# Patient Record
Sex: Male | Born: 1955 | Race: White | Hispanic: No | Marital: Single | Smoking: Never smoker
Health system: Southern US, Community
[De-identification: ages and names within clinical notes are randomized; demographics above are authoritative.]

## PROBLEM LIST (undated history)

## (undated) DIAGNOSIS — F1910 Other psychoactive substance abuse, uncomplicated: Secondary | ICD-10-CM

## (undated) DIAGNOSIS — I10 Essential (primary) hypertension: Secondary | ICD-10-CM

## (undated) DIAGNOSIS — H269 Unspecified cataract: Secondary | ICD-10-CM

## (undated) DIAGNOSIS — T7840XA Allergy, unspecified, initial encounter: Secondary | ICD-10-CM

## (undated) DIAGNOSIS — R569 Unspecified convulsions: Secondary | ICD-10-CM

## (undated) DIAGNOSIS — I2510 Atherosclerotic heart disease of native coronary artery without angina pectoris: Secondary | ICD-10-CM

## (undated) DIAGNOSIS — E785 Hyperlipidemia, unspecified: Secondary | ICD-10-CM

## (undated) HISTORY — DX: Hyperlipidemia, unspecified: E78.5

## (undated) HISTORY — DX: Allergy, unspecified, initial encounter: T78.40XA

## (undated) HISTORY — DX: Atherosclerotic heart disease of native coronary artery without angina pectoris: I25.10

## (undated) HISTORY — PX: COLONOSCOPY: SHX174

## (undated) HISTORY — DX: Other psychoactive substance abuse, uncomplicated: F19.10

## (undated) HISTORY — DX: Unspecified cataract: H26.9

## (undated) HISTORY — DX: Essential (primary) hypertension: I10

## (undated) HISTORY — DX: Unspecified convulsions: R56.9

---
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
## 2003-09-12 ENCOUNTER — Emergency Department (HOSPITAL_COMMUNITY): Admission: EM | Admit: 2003-09-12 | Discharge: 2003-09-12 | Payer: Self-pay | Admitting: Emergency Medicine

## 2005-11-09 ENCOUNTER — Ambulatory Visit: Payer: Self-pay | Admitting: Internal Medicine

## 2005-11-17 ENCOUNTER — Emergency Department (HOSPITAL_COMMUNITY): Admission: EM | Admit: 2005-11-17 | Discharge: 2005-11-17 | Payer: Self-pay | Admitting: Emergency Medicine

## 2005-11-21 ENCOUNTER — Ambulatory Visit: Payer: Self-pay | Admitting: Internal Medicine

## 2005-11-29 ENCOUNTER — Encounter: Admission: RE | Admit: 2005-11-29 | Discharge: 2005-11-29 | Payer: Self-pay | Admitting: Internal Medicine

## 2005-12-04 ENCOUNTER — Encounter: Admission: RE | Admit: 2005-12-04 | Discharge: 2005-12-04 | Payer: Self-pay | Admitting: Internal Medicine

## 2008-06-08 ENCOUNTER — Encounter: Admission: RE | Admit: 2008-06-08 | Discharge: 2008-06-08 | Payer: Self-pay | Admitting: Internal Medicine

## 2010-08-28 ENCOUNTER — Encounter: Payer: Self-pay | Admitting: Internal Medicine

## 2015-10-22 ENCOUNTER — Encounter: Payer: Self-pay | Admitting: Internal Medicine

## 2016-05-24 DIAGNOSIS — E041 Nontoxic single thyroid nodule: Secondary | ICD-10-CM | POA: Diagnosis not present

## 2016-05-24 DIAGNOSIS — R8299 Other abnormal findings in urine: Secondary | ICD-10-CM | POA: Diagnosis not present

## 2016-05-24 DIAGNOSIS — E784 Other hyperlipidemia: Secondary | ICD-10-CM | POA: Diagnosis not present

## 2016-05-24 DIAGNOSIS — I10 Essential (primary) hypertension: Secondary | ICD-10-CM | POA: Diagnosis not present

## 2016-05-24 DIAGNOSIS — R7301 Impaired fasting glucose: Secondary | ICD-10-CM | POA: Diagnosis not present

## 2016-05-24 DIAGNOSIS — Z125 Encounter for screening for malignant neoplasm of prostate: Secondary | ICD-10-CM | POA: Diagnosis not present

## 2016-05-31 DIAGNOSIS — R7301 Impaired fasting glucose: Secondary | ICD-10-CM | POA: Diagnosis not present

## 2016-05-31 DIAGNOSIS — R05 Cough: Secondary | ICD-10-CM | POA: Diagnosis not present

## 2016-05-31 DIAGNOSIS — E784 Other hyperlipidemia: Secondary | ICD-10-CM | POA: Diagnosis not present

## 2016-05-31 DIAGNOSIS — Z0000 Encounter for general adult medical examination without abnormal findings: Secondary | ICD-10-CM | POA: Diagnosis not present

## 2016-05-31 DIAGNOSIS — Z125 Encounter for screening for malignant neoplasm of prostate: Secondary | ICD-10-CM | POA: Diagnosis not present

## 2016-05-31 DIAGNOSIS — Z1389 Encounter for screening for other disorder: Secondary | ICD-10-CM | POA: Diagnosis not present

## 2016-05-31 DIAGNOSIS — Z23 Encounter for immunization: Secondary | ICD-10-CM | POA: Diagnosis not present

## 2016-05-31 DIAGNOSIS — I10 Essential (primary) hypertension: Secondary | ICD-10-CM | POA: Diagnosis not present

## 2016-06-15 ENCOUNTER — Encounter: Payer: Self-pay | Admitting: Internal Medicine

## 2016-08-10 ENCOUNTER — Ambulatory Visit (AMBULATORY_SURGERY_CENTER): Payer: Self-pay | Admitting: *Deleted

## 2016-08-10 VITALS — Ht 71.0 in | Wt 179.8 lb

## 2016-08-10 DIAGNOSIS — Z1211 Encounter for screening for malignant neoplasm of colon: Secondary | ICD-10-CM

## 2016-08-10 MED ORDER — NA SULFATE-K SULFATE-MG SULF 17.5-3.13-1.6 GM/177ML PO SOLN: ORAL | 0 refills | Status: DC

## 2016-08-10 NOTE — Progress Notes (Signed)
No egg or soy allergy  No anesthesia or intubation problems per pt  No diet medications taken  Registered in EMMI  No home oxygen used or hx of sleep apnea  PNM than $50 coupon Suprep given

## 2016-08-16 ENCOUNTER — Encounter: Payer: Self-pay | Admitting: Internal Medicine

## 2016-08-23 ENCOUNTER — Ambulatory Visit (AMBULATORY_SURGERY_CENTER): Payer: BLUE CROSS/BLUE SHIELD | Admitting: Internal Medicine

## 2016-08-23 ENCOUNTER — Encounter: Payer: Self-pay | Admitting: Internal Medicine

## 2016-08-23 VITALS — BP 122/79 | HR 76 | Temp 97.8°F | Resp 12 | Ht 71.0 in | Wt 179.0 lb

## 2016-08-23 DIAGNOSIS — Z1212 Encounter for screening for malignant neoplasm of rectum: Secondary | ICD-10-CM

## 2016-08-23 DIAGNOSIS — D125 Benign neoplasm of sigmoid colon: Secondary | ICD-10-CM

## 2016-08-23 DIAGNOSIS — K635 Polyp of colon: Secondary | ICD-10-CM | POA: Diagnosis not present

## 2016-08-23 DIAGNOSIS — Z1211 Encounter for screening for malignant neoplasm of colon: Secondary | ICD-10-CM | POA: Diagnosis present

## 2016-08-23 MED ORDER — SODIUM CHLORIDE 0.9 % IV SOLN: 500.0000 mL | INTRAVENOUS | Status: AC

## 2016-08-23 NOTE — Op Note (Signed)
[facility] Patient Name: Cameron Obrien Procedure Date: 08/23/2016 12:05 PM MRN: [id number] Endoscopist: Docia Chuck. Henrene Pastor , MD Age: 61 Referring MD:  Date of Birth: 1956/02/07 Gender: Male Account #: [id number] Procedure:                Colonoscopy with cold snare polypectomy x 1 Indications:              Screening for colorectal malignant neoplasm.                            Negative index exam 2007 Medicines:                Monitored Anesthesia Care Procedure:                Pre-Anesthesia Assessment:                           - Prior to the procedure, a History and Physical                            was performed, and patient medications and                            allergies were reviewed. The patient's tolerance of                            previous anesthesia was also reviewed. The risks                            and benefits of the procedure and the sedation                            options and risks were discussed with the patient.                            All questions were answered, and informed consent                            was obtained. Prior Anticoagulants: The patient has                            taken no previous anticoagulant or antiplatelet                            agents. ASA Grade Assessment: II - A patient with                            mild systemic disease. After reviewing the risks                            and benefits, the patient was deemed in                            satisfactory condition to undergo the procedure.  After obtaining informed consent, the colonoscope                            was passed under direct vision. Throughout the                            procedure, the patient's blood pressure, pulse, and                            oxygen saturations were monitored continuously. The                            Model CF-HQ190L [id number]) scope was introduced                            through  the anus and advanced to the the cecum,                            identified by appendiceal orifice and ileocecal                            valve. The ileocecal valve, appendiceal orifice,                            and rectum were photographed. The quality of the                            bowel preparation was excellent. The colonoscopy                            was performed without difficulty. The patient                            tolerated the procedure well. The bowel preparation                            used was SUPREP. Scope In: 12:27:09 PM Scope Out: 12:39:45 PM Total Procedure Duration: 0 hours 12 minutes 36 seconds  Findings:                 A 5 mm polyp was found in the sigmoid colon. The                            polyp was removed with a cold snare. Resection and                            retrieval were complete.                           A few diverticula were found in the cecum,                            ascending colon, and sigmoid colon.  Internal hemorrhoids were found during retroflexion.                           The exam was otherwise without abnormality on                            direct and retroflexion views. Complications:            No immediate complications. Estimated blood loss:                            None. Estimated Blood Loss:     Estimated blood loss: none. Impression:               - One 5 mm polyp in the sigmoid colon, removed with                            a cold snare. Resected and retrieved.                           - Diverticulosis in the colon.                           - Internal hemorrhoids.                           - The examination was otherwise normal on direct                            and retroflexion views. Recommendation:           - Repeat colonoscopy in 5-10 years for surveillance.                           - Patient has a contact number available for                            emergencies. The  signs and symptoms of potential                            delayed complications were discussed with the                            patient. Return to normal activities tomorrow.                            Written discharge instructions were provided to the                            patient.                           - Resume previous diet.                           - Continue present medications.                           -  Await pathology results. Docia Chuck. Henrene Pastor, MD 08/23/2016 12:46:00 PM This report has been signed electronically.

## 2016-08-23 NOTE — Patient Instructions (Signed)
YOU HAD AN ENDOSCOPIC PROCEDURE TODAY AT THE [facility]:   Refer to the procedure report that was given to you for any specific questions about what was found during the examination.  If the procedure report does not answer your questions, please call your gastroenterologist to clarify.  If you requested that your care partner not be given the details of your procedure findings, then the procedure report has been included in a sealed envelope for you to review at your convenience later.  YOU SHOULD EXPECT: Some feelings of bloating in the abdomen. Passage of more gas than usual.  Walking can help get rid of the air that was put into your GI tract during the procedure and reduce the bloating. If you had a lower endoscopy (such as a colonoscopy or flexible sigmoidoscopy) you may notice spotting of blood in your stool or on the toilet paper. If you underwent a bowel prep for your procedure, you may not have a normal bowel movement for a few days.  Please Note:  You might notice some irritation and congestion in your nose or some drainage.  This is from the oxygen used during your procedure.  There is no need for concern and it should clear up in a day or so.  SYMPTOMS TO REPORT IMMEDIATELY:   Following lower endoscopy (colonoscopy or flexible sigmoidoscopy):  Excessive amounts of blood in the stool  Significant tenderness or worsening of abdominal pains  Swelling of the abdomen that is new, acute  Fever of 100F or higher   Following upper endoscopy (EGD)  Vomiting of blood or coffee ground material  New chest pain or pain under the shoulder blades  Painful or persistently difficult swallowing  New shortness of breath  Fever of 100F or higher  Black, tarry-looking stools  For urgent or emergent issues, a gastroenterologist can be reached at any hour by calling [phone].   DIET:  We do recommend a small meal at first, but then you may proceed to your regular diet.  Drink  plenty of fluids but you should avoid alcoholic beverages for 24 hours.  ACTIVITY:  You should plan to take it easy for the rest of today and you should NOT DRIVE or use heavy machinery until tomorrow (because of the sedation medicines used during the test).    FOLLOW UP: Our staff will call the number listed on your records the next business day following your procedure to check on you and address any questions or concerns that you may have regarding the information given to you following your procedure. If we do not reach you, we will leave a message.  However, if you are feeling well and you are not experiencing any problems, there is no need to return our call.  We will assume that you have returned to your regular daily activities without incident.  If any biopsies were taken you will be contacted by phone or by letter within the next 1-3 weeks.  Please call us at [phone] if you have not heard about the biopsies in 3 weeks.    SIGNATURES/CONFIDENTIALITY: You and/or your care partner have signed paperwork which will be entered into your electronic medical record.  These signatures attest to the fact that that the information above on your After Visit Summary has been reviewed and is understood.  Full responsibility of the confidentiality of this discharge information lies with you and/or your care-partner.  Polyp, diverticulosis, high fiber diet and hemorrhoid information given. 

## 2016-08-23 NOTE — Progress Notes (Signed)
Called to room to assist during endoscopic procedure.  Patient ID and intended procedure confirmed with present staff. Received instructions for my participation in the procedure from the performing physician.  

## 2016-08-23 NOTE — Progress Notes (Signed)
Patient awakening,vss,report to rn 

## 2016-08-25 ENCOUNTER — Telehealth: Payer: Self-pay | Admitting: *Deleted

## 2016-08-25 NOTE — Telephone Encounter (Signed)
Message left at [phone].

## 2016-08-28 ENCOUNTER — Telehealth: Payer: Self-pay

## 2016-08-28 NOTE — Telephone Encounter (Signed)
  Follow up Call-  Call back number 08/23/2016  Post procedure Call Back phone  # [phone]  Permission to leave phone message Yes  Some recent data might be hidden     Patient questions:  Do you have a fever, pain , or abdominal swelling? No. Pain Score  0 *  Have you tolerated food without any problems? Yes.    Have you been able to return to your normal activities? Yes.    Do you have any questions about your discharge instructions: Diet   No. Medications  No. Follow up visit  No.  Do you have questions or concerns about your Care? No.  Actions: * If pain score is 4 or above: No action needed, pain <4.

## 2016-08-29 ENCOUNTER — Encounter: Payer: Self-pay | Admitting: Internal Medicine

## 2016-12-21 DIAGNOSIS — H2513 Age-related nuclear cataract, bilateral: Secondary | ICD-10-CM | POA: Diagnosis not present

## 2017-01-03 DIAGNOSIS — H2512 Age-related nuclear cataract, left eye: Secondary | ICD-10-CM | POA: Diagnosis not present

## 2017-01-03 DIAGNOSIS — H2511 Age-related nuclear cataract, right eye: Secondary | ICD-10-CM | POA: Diagnosis not present

## 2017-01-10 DIAGNOSIS — H2512 Age-related nuclear cataract, left eye: Secondary | ICD-10-CM | POA: Diagnosis not present

## 2017-01-24 DIAGNOSIS — Z6824 Body mass index (BMI) 24.0-24.9, adult: Secondary | ICD-10-CM | POA: Diagnosis not present

## 2017-01-24 DIAGNOSIS — J069 Acute upper respiratory infection, unspecified: Secondary | ICD-10-CM | POA: Diagnosis not present

## 2017-01-24 DIAGNOSIS — R05 Cough: Secondary | ICD-10-CM | POA: Diagnosis not present

## 2017-05-10 DIAGNOSIS — M25522 Pain in left elbow: Secondary | ICD-10-CM | POA: Diagnosis not present

## 2017-05-10 DIAGNOSIS — Z23 Encounter for immunization: Secondary | ICD-10-CM | POA: Diagnosis not present

## 2017-05-10 DIAGNOSIS — L259 Unspecified contact dermatitis, unspecified cause: Secondary | ICD-10-CM | POA: Diagnosis not present

## 2017-05-10 DIAGNOSIS — Z6824 Body mass index (BMI) 24.0-24.9, adult: Secondary | ICD-10-CM | POA: Diagnosis not present

## 2017-05-16 DIAGNOSIS — L249 Irritant contact dermatitis, unspecified cause: Secondary | ICD-10-CM | POA: Diagnosis not present

## 2017-05-31 DIAGNOSIS — E041 Nontoxic single thyroid nodule: Secondary | ICD-10-CM | POA: Diagnosis not present

## 2017-05-31 DIAGNOSIS — I10 Essential (primary) hypertension: Secondary | ICD-10-CM | POA: Diagnosis not present

## 2017-05-31 DIAGNOSIS — R7301 Impaired fasting glucose: Secondary | ICD-10-CM | POA: Diagnosis not present

## 2017-05-31 DIAGNOSIS — Z125 Encounter for screening for malignant neoplasm of prostate: Secondary | ICD-10-CM | POA: Diagnosis not present

## 2017-05-31 DIAGNOSIS — Z0000 Encounter for general adult medical examination without abnormal findings: Secondary | ICD-10-CM | POA: Diagnosis not present

## 2017-06-04 DIAGNOSIS — Z1212 Encounter for screening for malignant neoplasm of rectum: Secondary | ICD-10-CM | POA: Diagnosis not present

## 2017-06-05 DIAGNOSIS — E7849 Other hyperlipidemia: Secondary | ICD-10-CM | POA: Diagnosis not present

## 2017-06-05 DIAGNOSIS — I10 Essential (primary) hypertension: Secondary | ICD-10-CM | POA: Diagnosis not present

## 2017-06-05 DIAGNOSIS — Z0000 Encounter for general adult medical examination without abnormal findings: Secondary | ICD-10-CM | POA: Diagnosis not present

## 2017-06-05 DIAGNOSIS — N400 Enlarged prostate without lower urinary tract symptoms: Secondary | ICD-10-CM | POA: Diagnosis not present

## 2017-06-05 DIAGNOSIS — Z1389 Encounter for screening for other disorder: Secondary | ICD-10-CM | POA: Diagnosis not present

## 2017-06-05 DIAGNOSIS — R7301 Impaired fasting glucose: Secondary | ICD-10-CM | POA: Diagnosis not present

## 2017-06-20 DIAGNOSIS — Z961 Presence of intraocular lens: Secondary | ICD-10-CM | POA: Diagnosis not present

## 2018-05-30 DIAGNOSIS — Z23 Encounter for immunization: Secondary | ICD-10-CM | POA: Diagnosis not present

## 2018-06-07 DIAGNOSIS — Z0000 Encounter for general adult medical examination without abnormal findings: Secondary | ICD-10-CM | POA: Diagnosis not present

## 2018-06-07 DIAGNOSIS — Z125 Encounter for screening for malignant neoplasm of prostate: Secondary | ICD-10-CM | POA: Diagnosis not present

## 2018-06-07 DIAGNOSIS — I10 Essential (primary) hypertension: Secondary | ICD-10-CM | POA: Diagnosis not present

## 2018-06-07 DIAGNOSIS — R82998 Other abnormal findings in urine: Secondary | ICD-10-CM | POA: Diagnosis not present

## 2018-06-07 DIAGNOSIS — R7301 Impaired fasting glucose: Secondary | ICD-10-CM | POA: Diagnosis not present

## 2018-06-07 DIAGNOSIS — E041 Nontoxic single thyroid nodule: Secondary | ICD-10-CM | POA: Diagnosis not present

## 2018-06-12 DIAGNOSIS — I10 Essential (primary) hypertension: Secondary | ICD-10-CM | POA: Diagnosis not present

## 2018-06-12 DIAGNOSIS — R0789 Other chest pain: Secondary | ICD-10-CM | POA: Diagnosis not present

## 2018-06-12 DIAGNOSIS — Z0000 Encounter for general adult medical examination without abnormal findings: Secondary | ICD-10-CM | POA: Diagnosis not present

## 2018-06-12 DIAGNOSIS — E7849 Other hyperlipidemia: Secondary | ICD-10-CM | POA: Diagnosis not present

## 2018-06-12 DIAGNOSIS — R7301 Impaired fasting glucose: Secondary | ICD-10-CM | POA: Diagnosis not present

## 2018-06-12 DIAGNOSIS — Z125 Encounter for screening for malignant neoplasm of prostate: Secondary | ICD-10-CM | POA: Diagnosis not present

## 2018-06-12 DIAGNOSIS — Z1389 Encounter for screening for other disorder: Secondary | ICD-10-CM | POA: Diagnosis not present

## 2018-06-14 ENCOUNTER — Other Ambulatory Visit: Payer: Self-pay | Admitting: Internal Medicine

## 2018-06-14 DIAGNOSIS — E785 Hyperlipidemia, unspecified: Secondary | ICD-10-CM

## 2018-06-14 DIAGNOSIS — Z8249 Family history of ischemic heart disease and other diseases of the circulatory system: Secondary | ICD-10-CM

## 2018-06-19 DIAGNOSIS — Z1212 Encounter for screening for malignant neoplasm of rectum: Secondary | ICD-10-CM | POA: Diagnosis not present

## 2018-06-24 ENCOUNTER — Ambulatory Visit
Admission: RE | Admit: 2018-06-24 | Discharge: 2018-06-24 | Disposition: A | Discharge status: Home / Self Care | Payer: BLUE CROSS/BLUE SHIELD | Source: Ambulatory Visit | Attending: Internal Medicine | Admitting: Internal Medicine

## 2018-06-24 DIAGNOSIS — Z8249 Family history of ischemic heart disease and other diseases of the circulatory system: Secondary | ICD-10-CM | POA: Diagnosis not present

## 2018-06-24 DIAGNOSIS — E785 Hyperlipidemia, unspecified: Secondary | ICD-10-CM

## 2018-07-12 DIAGNOSIS — I10 Essential (primary) hypertension: Secondary | ICD-10-CM | POA: Diagnosis not present

## 2018-07-12 DIAGNOSIS — Z6825 Body mass index (BMI) 25.0-25.9, adult: Secondary | ICD-10-CM | POA: Diagnosis not present

## 2018-07-22 DIAGNOSIS — Z961 Presence of intraocular lens: Secondary | ICD-10-CM | POA: Diagnosis not present

## 2018-07-26 DIAGNOSIS — I10 Essential (primary) hypertension: Secondary | ICD-10-CM | POA: Diagnosis not present

## 2018-08-13 DIAGNOSIS — Z6825 Body mass index (BMI) 25.0-25.9, adult: Secondary | ICD-10-CM | POA: Diagnosis not present

## 2018-08-13 DIAGNOSIS — I10 Essential (primary) hypertension: Secondary | ICD-10-CM | POA: Diagnosis not present

## 2018-08-27 DIAGNOSIS — Z6825 Body mass index (BMI) 25.0-25.9, adult: Secondary | ICD-10-CM | POA: Diagnosis not present

## 2018-08-27 DIAGNOSIS — I10 Essential (primary) hypertension: Secondary | ICD-10-CM | POA: Diagnosis not present

## 2018-09-26 DIAGNOSIS — I10 Essential (primary) hypertension: Secondary | ICD-10-CM | POA: Diagnosis not present

## 2018-09-26 DIAGNOSIS — E876 Hypokalemia: Secondary | ICD-10-CM | POA: Diagnosis not present

## 2018-09-26 DIAGNOSIS — R079 Chest pain, unspecified: Secondary | ICD-10-CM | POA: Diagnosis not present

## 2018-09-26 DIAGNOSIS — K5900 Constipation, unspecified: Secondary | ICD-10-CM | POA: Diagnosis not present

## 2018-10-21 ENCOUNTER — Ambulatory Visit: Payer: Self-pay | Admitting: Cardiology

## 2018-11-07 ENCOUNTER — Ambulatory Visit: Payer: Self-pay | Admitting: Cardiology

## 2018-11-29 ENCOUNTER — Telehealth: Payer: BLUE CROSS/BLUE SHIELD | Admitting: Cardiology

## 2018-12-02 ENCOUNTER — Encounter: Payer: Self-pay | Admitting: Cardiology

## 2018-12-04 ENCOUNTER — Encounter: Payer: Self-pay | Admitting: Cardiology

## 2018-12-04 ENCOUNTER — Ambulatory Visit: Payer: BLUE CROSS/BLUE SHIELD | Admitting: Cardiology

## 2018-12-04 DIAGNOSIS — R931 Abnormal findings on diagnostic imaging of heart and coronary circulation: Secondary | ICD-10-CM | POA: Insufficient documentation

## 2018-12-04 DIAGNOSIS — R0789 Other chest pain: Secondary | ICD-10-CM | POA: Insufficient documentation

## 2018-12-04 DIAGNOSIS — E7800 Pure hypercholesterolemia, unspecified: Secondary | ICD-10-CM | POA: Insufficient documentation

## 2018-12-04 NOTE — Progress Notes (Deleted)
Primary Physician/Referring:  Marton Redwood, MD  Patient ID: Cameron Obrien, male    DOB: 01-Jul-1956, 63 y.o.   MRN: [id number]  No chief complaint on file.   HPI: Cameron Obrien  is a 63 y.o. male  with ***  Past Medical History:  Diagnosis Date  . Allergy   . CAD (coronary artery disease)   . Cataract   . HTN (hypertension)   . Seizures ([place])    at age 28- none since then  . Substance abuse ([place])    hx marijuana used    Past Surgical History:  Procedure Laterality Date  . COLONOSCOPY      Social History   Socioeconomic History  . Marital status: Single    Spouse name: Not on file  . Number of children: 0  . Years of education: Not on file  . Highest education level: Not on file  Occupational History  . Not on file  Social Needs  . Financial resource strain: Not on file  . Food insecurity:    Worry: Not on file    Inability: Not on file  . Transportation needs:    Medical: Not on file    Non-medical: Not on file  Tobacco Use  . Smoking status: Former Research scientist (life sciences)  . Smokeless tobacco: Never Used  . Tobacco comment: "as a kid"  Substance and Sexual Activity  . Alcohol use: Yes    Comment: occasionally  . Drug use: Yes    Types: Marijuana    Comment: not for 20 years  . Sexual activity: Not on file  Lifestyle  . Physical activity:    Days per week: Not on file    Minutes per session: Not on file  . Stress: Not on file  Relationships  . Social connections:    Talks on phone: Not on file    Gets together: Not on file    Attends religious service: Not on file    Active member of club or organization: Not on file    Attends meetings of clubs or organizations: Not on file    Relationship status: Not on file  . Intimate partner violence:    Fear of current or ex partner: Not on file    Emotionally abused: Not on file    Physically abused: Not on file    Forced sexual activity: Not on file  Other Topics Concern  . Not on file  Social History Narrative   . Not on file    Current Outpatient Medications on File Prior to Visit  Medication Sig Dispense Refill  . chlorthalidone (HYGROTON) 50 MG tablet Take by mouth daily.    . potassium chloride (K-DUR) 10 MEQ tablet Take 10 mEq by mouth daily.    . rosuvastatin (CRESTOR) 10 MG tablet Take 10 mg by mouth daily.    Marland Kitchen aspirin EC 81 MG tablet Take 81 mg by mouth as needed.    . diphenhydrAMINE (BENADRYL) 25 mg capsule Take 25 mg by mouth at bedtime as needed.     Current Facility-Administered Medications on File Prior to Visit  Medication Dose Route Frequency Provider Last Rate Last Dose  . 0.9 %  sodium chloride infusion  500 mL Intravenous Continuous Irene Shipper, MD        ***ROS    Objective  There were no vitals taken for this visit. There is no height or weight on file to calculate BMI.    ***Physical Exam Radiology: No results found.  Laboratory examination:   Labs 09/26/18/20: Serum glucose 113 mg, BUN 11, creatinine 1.1, eGFR greater than 61, potassium 3.3.  Cardiac Studies:    Coronary calcium score 06/24/2018: Agaston score 144 corresponds to  69% for age and sex match individual.  Multiple tiny pulmonary nodules noted, limited exam.   Assessment   Chest pain, atypical  Agatston coronary artery calcium score between 100 and 199 06/24/2018  Hypercholesteremia  ***  Recommendations:   ***  Cameron Prows, MD, [facility] 12/04/2018, 7:45 AM [facility]. [place] Pager: [phone] Office: [phone] If no answer Cell [phone]

## 2018-12-31 ENCOUNTER — Telehealth: Payer: BLUE CROSS/BLUE SHIELD | Admitting: Cardiology

## 2019-01-02 ENCOUNTER — Ambulatory Visit: Payer: BLUE CROSS/BLUE SHIELD | Admitting: Cardiology

## 2019-01-02 ENCOUNTER — Encounter: Payer: Self-pay | Admitting: Cardiology

## 2019-01-02 ENCOUNTER — Other Ambulatory Visit: Payer: Self-pay

## 2019-01-02 VITALS — BP 136/98 | HR 84 | Temp 97.8°F | Ht 71.0 in | Wt 179.0 lb

## 2019-01-02 DIAGNOSIS — E782 Mixed hyperlipidemia: Secondary | ICD-10-CM | POA: Diagnosis not present

## 2019-01-02 DIAGNOSIS — R0609 Other forms of dyspnea: Secondary | ICD-10-CM | POA: Diagnosis not present

## 2019-01-02 DIAGNOSIS — R0789 Other chest pain: Secondary | ICD-10-CM | POA: Diagnosis not present

## 2019-01-02 DIAGNOSIS — R931 Abnormal findings on diagnostic imaging of heart and coronary circulation: Secondary | ICD-10-CM | POA: Diagnosis not present

## 2019-01-02 DIAGNOSIS — R0600 Dyspnea, unspecified: Secondary | ICD-10-CM

## 2019-01-02 DIAGNOSIS — I10 Essential (primary) hypertension: Secondary | ICD-10-CM

## 2019-01-02 HISTORY — DX: Essential (primary) hypertension: I10

## 2019-01-02 MED ORDER — OLMESARTAN MEDOXOMIL-HCTZ 20-12.5 MG PO TABS: 1.0000 | ORAL_TABLET | ORAL | 2 refills | Status: DC

## 2019-01-02 MED ORDER — ROSUVASTATIN CALCIUM 20 MG PO TABS: 20.0000 mg | ORAL_TABLET | Freq: Every day | ORAL | 2 refills | Status: DC
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
## 2019-01-30 DIAGNOSIS — E7849 Other hyperlipidemia: Secondary | ICD-10-CM | POA: Diagnosis not present

## 2019-03-05 ENCOUNTER — Other Ambulatory Visit: Payer: Self-pay

## 2019-03-05 ENCOUNTER — Ambulatory Visit (INDEPENDENT_AMBULATORY_CARE_PROVIDER_SITE_OTHER): Payer: BC Managed Care – PPO

## 2019-03-05 DIAGNOSIS — R931 Abnormal findings on diagnostic imaging of heart and coronary circulation: Secondary | ICD-10-CM

## 2019-03-05 DIAGNOSIS — R0789 Other chest pain: Secondary | ICD-10-CM | POA: Diagnosis not present

## 2019-03-07 NOTE — Progress Notes (Signed)
LMOM advising of results and pending appt.//ah

## 2019-03-19 ENCOUNTER — Other Ambulatory Visit: Payer: BLUE CROSS/BLUE SHIELD

## 2019-03-24 ENCOUNTER — Ambulatory Visit (INDEPENDENT_AMBULATORY_CARE_PROVIDER_SITE_OTHER): Payer: BC Managed Care – PPO

## 2019-03-24 ENCOUNTER — Other Ambulatory Visit: Payer: Self-pay

## 2019-03-24 DIAGNOSIS — R0600 Dyspnea, unspecified: Secondary | ICD-10-CM

## 2019-03-24 DIAGNOSIS — R0609 Other forms of dyspnea: Secondary | ICD-10-CM | POA: Diagnosis not present

## 2019-03-24 DIAGNOSIS — R0789 Other chest pain: Secondary | ICD-10-CM | POA: Diagnosis not present

## 2019-03-28 ENCOUNTER — Encounter: Payer: Self-pay | Admitting: Cardiology

## 2019-03-28 ENCOUNTER — Ambulatory Visit (INDEPENDENT_AMBULATORY_CARE_PROVIDER_SITE_OTHER): Payer: BC Managed Care – PPO | Admitting: Cardiology

## 2019-03-28 ENCOUNTER — Other Ambulatory Visit: Payer: Self-pay

## 2019-03-28 VITALS — BP 146/83 | HR 72 | Ht 72.0 in | Wt 187.0 lb

## 2019-03-28 DIAGNOSIS — I10 Essential (primary) hypertension: Secondary | ICD-10-CM | POA: Diagnosis not present

## 2019-03-28 DIAGNOSIS — R931 Abnormal findings on diagnostic imaging of heart and coronary circulation: Secondary | ICD-10-CM

## 2019-03-28 DIAGNOSIS — E782 Mixed hyperlipidemia: Secondary | ICD-10-CM | POA: Diagnosis not present

## 2019-03-28 DIAGNOSIS — R0609 Other forms of dyspnea: Secondary | ICD-10-CM | POA: Diagnosis not present

## 2019-03-28 DIAGNOSIS — R0600 Dyspnea, unspecified: Secondary | ICD-10-CM

## 2019-03-28 NOTE — Progress Notes (Signed)
Primary Physician/Referring:  Marton Redwood, MD  Patient ID: Cameron Obrien, male    DOB: Dec 06, 1955, 63 y.o.   MRN: [id number]  Chief Complaint  Patient presents with  . Shortness of Breath  . Follow-up    HPI: Cameron Obrien  is a 63 y.o. male  with Hypertension, hyperlipidemia, recently evaluated by [place] for chest pain and dyspnea.  Patient had recently had central chest tightness and worsening dyspnea on exertion. He underwent lexiscan nuclear stress test and echocardiogram and now presents for follow up.  He was started on Olmesartan HCT for hypertension at his last office visit, he has only been able to tolerate half a tablet, but has noticed improvement in BP. He is overall feeling much better. He has not had any episodes of chest pain. He has tried to be more active and has been walking and feels that his dyspnea on exertion is improving.   He does states that he has cut back on his marijuana and alcohol use.   Past Medical History:  Diagnosis Date  . Allergy   . CAD (coronary artery disease)   . Cataract   . Essential hypertension 01/02/2019  . Hyperlipidemia   . Seizures ([place])    at age 77- none since then  . Substance abuse ([place])    hx marijuana used    Past Surgical History:  Procedure Laterality Date  . COLONOSCOPY      Social History   Socioeconomic History  . Marital status: Single    Spouse name: Not on file  . Number of children: 0  . Years of education: Not on file  . Highest education level: Not on file  Occupational History  . Not on file  Social Needs  . Financial resource strain: Not on file  . Food insecurity    Worry: Not on file    Inability: Not on file  . Transportation needs    Medical: Not on file    Non-medical: Not on file  Tobacco Use  . Smoking status: Former Research scientist (life sciences)  . Smokeless tobacco: Never Used  . Tobacco comment: "as a kid"  Substance and Sexual Activity  . Alcohol use: Yes    Comment: occasionally  . Drug use: Yes     Types: Marijuana    Comment: not for 20 years  . Sexual activity: Not on file  Lifestyle  . Physical activity    Days per week: Not on file    Minutes per session: Not on file  . Stress: Not on file  Relationships  . Social Herbalist on phone: Not on file    Gets together: Not on file    Attends religious service: Not on file    Active member of club or organization: Not on file    Attends meetings of clubs or organizations: Not on file    Relationship status: Not on file  . Intimate partner violence    Fear of current or ex partner: Not on file    Emotionally abused: Not on file    Physically abused: Not on file    Forced sexual activity: Not on file  Other Topics Concern  . Not on file  Social History Narrative  . Not on file    Review of Systems  Constitution: Negative for chills, decreased appetite, malaise/fatigue and weight gain.  Cardiovascular: Positive for chest pain and dyspnea on exertion. Negative for leg swelling and syncope.  Respiratory: Positive for cough (cough).  Negative for sputum production.   Endocrine: Negative for cold intolerance.  Hematologic/Lymphatic: Does not bruise/bleed easily.  Musculoskeletal: Negative for joint swelling.  Gastrointestinal: Positive for constipation (on mild of magnesia and helps). Negative for abdominal pain, anorexia, change in bowel habit, hematochezia and melena.  Neurological: Positive for dizziness (occasional with cough). Negative for headaches and light-headedness.  Psychiatric/Behavioral: Negative for depression and substance abuse.  All other systems reviewed and are negative.     Objective  Blood pressure (!) 146/83, pulse 72, height 6' (1.829 m), weight 187 lb (84.8 kg), SpO2 97 %. Body mass index is 25.36 kg/m.     Flowsheets     01/02/19 9:50 AM  01/02/19 10:04 AM   BP  145/98  136/98   BP Location  LeftArm  LeftArm   Patient Position  Sitting  Sitting   Cuff Size  Normal  Normal    Pulse  86  84   Temp  97.53F(36.6C)    SpO2  96%    Weight  179lb(81.2kg)    Height  5'11"(1.87m        Physical Exam  Constitutional: He appears well-developed and well-nourished. No distress.  HENT:  Head: Atraumatic.  Eyes: Conjunctivae are normal.  Neck: Neck supple. No JVD present. No thyromegaly present.  Cardiovascular: Normal rate, regular rhythm, normal heart sounds and intact distal pulses. Exam reveals no gallop.  No murmur heard. Pulmonary/Chest: Effort normal and breath sounds normal.  Abdominal: Soft. Bowel sounds are normal.  Musculoskeletal: Normal range of motion.  Neurological: He is alert.  Skin: Skin is warm and dry.  Psychiatric: He has a normal mood and affect.   Radiology: No results found.  Laboratory examination:   Labs 09/26/18/20: Serum glucose 113 mg, BUN 11, creatinine 1.1, eGFR 67 mL, potassium 3.3. Lipid panel 06/07/2018: Total cholesterol 219, triglycerides 239, HDL 56, LDL 113. TSH No results for input(s): TSH in the last 8760 hours.  PRN Meds:. There are no discontinued medications. Current Meds  Medication Sig  . aspirin EC 81 MG tablet Take 81 mg by mouth daily.  . diphenhydrAMINE (BENADRYL) 25 mg capsule Take 25 mg by mouth at bedtime as needed.  .Marland Kitchenolmesartan-hydrochlorothiazide (BENICAR HCT) 20-12.5 MG tablet Take 1 tablet by mouth every morning. (Patient taking differently: Take by mouth every morning. 1/2 tablet)  . potassium chloride SA (K-DUR) 20 MEQ tablet Take 20 mEq by mouth 2 (two) times daily.  . rosuvastatin (CRESTOR) 20 MG tablet Take 1 tablet (20 mg total) by mouth daily.   Current Facility-Administered Medications for the 03/28/19 encounter (Office Visit) with GAdrian Prows MD  Medication  . 0.9 %  sodium chloride infusion    Cardiac Studies:   Lexiscan Sestamibi Stress Test 03/05/2019: Stress EKG is non-diagnostic due to pharmacologic stress testing.  Patient remained asymptomatic. Perfusion study  demonstrates a small to medium-sized defect in the inferior wall extending from base to the apex suggestive of diaphragmatic attenuation.  Mild ischemia in the same region cannot be completely excluded. All segments of left ventricle demonstrated normal wall motion and thickening. Stress LV EF is normal 55%.  Low risk study.  Echocardiogram 03/24/2019: Left ventricle cavity is normal in size. Mild concentric hypertrophy of the left ventricle. Normal global wall motion. Normal LV systolic function with EF 55%. Doppler evidence of grade I (impaired) diastolic dysfunction, normal LAP.  Mild (Grade I) mitral regurgitation. Mild tricuspid regurgitation.  No evidence of pulmonary hypertension.  Coronary calcium score 06/24/2018: Total Agatson score 144,  69% I'll.  Multiple tiny pulmonary nodules.  Assessment     ICD-10-CM   1. Chest pain, atypical  R07.89   2. Dyspnea on exertion  R06.09   3. Agatston coronary artery calcium score between 100 and 199 06/24/2018  R93.1   4. Mixed hyperlipidemia  E78.2 Lipid Profile    Lipid Profile    EKG 01/02/2019: Normal sinus rhythm at the rate of 80 bpm, left atrial enlargement, no evidence of ischemia.  Recommendations:   I discussed recently obtained test results with the patient, had mild blood pressure changes noted to echocardiogram, normal LVEF.  He is now taking half a tablet of olmesartan hydrochlorothiazide as he did not feel well with full tablet.  His blood pressure has significantly improved, but is not quite at goal.  I have asked him to continue to monitor regularly and to take half a tablet in the morning and half a tablet in the evening for improved blood pressure control.  Nuclear stress test results were discussed with the patient, although considered low risk, he did have small medium sized possible area of ischemia on the inferior wall, suggestive of diaphragmatic attenuation, but ischemia cannot be completely excluded.  His symptoms  have been improving with increasing his activity level.  No recent episodes of chest pain.  He does continue to have dyspnea on exertion, but does feel that this is getting better.  In view of his improvement in symptoms, will continue with aggressive risk factor modification and if symptoms again worsen, will further evaluate.  He is on Crestor 20 mg for hyperlipidemia and has not had recent lipid panel, will obtain this for continued surveillance.  I will notify him of the results.  He has been able to cut back on his alcohol intake to only a few days a week and on the number of drinks he has for sitting and is feeling better with this.  Encouraged him to continue.  I will see him back in 3 months for follow-up on his symptoms, and if risk factors are well controlled and he continues to feel well, will consider PRN follow-up.   Miquel Dunn, MD, [facility] 03/28/2019, 10:24 AM [facility] [place] Pager: [phone] Office: [phone] If no answer Cell [phone]

## 2019-04-08 DIAGNOSIS — E782 Mixed hyperlipidemia: Secondary | ICD-10-CM | POA: Diagnosis not present

## 2019-04-09 LAB — LIPID PANEL
Chol/HDL Ratio: 3.2 ratio (ref 0.0–5.0)
Cholesterol, Total: 158 mg/dL (ref 100–199)
HDL: 49 mg/dL (ref >39)
LDL Chol Calc (NIH): 71 mg/dL (ref 0–99)
Triglycerides: 230 mg/dL — ABNORMAL HIGH (ref 0–149)
VLDL Cholesterol Cal: 38 mg/dL (ref 5–40)

## 2019-04-09 NOTE — Progress Notes (Signed)
Pt aware of results and pending appt.//ah

## 2019-05-12 ENCOUNTER — Other Ambulatory Visit: Payer: Self-pay

## 2019-05-12 DIAGNOSIS — E782 Mixed hyperlipidemia: Secondary | ICD-10-CM

## 2019-05-12 MED ORDER — ROSUVASTATIN CALCIUM 20 MG PO TABS: 20.0000 mg | ORAL_TABLET | Freq: Every day | ORAL | 2 refills | Status: AC

## 2019-06-13 DIAGNOSIS — Z23 Encounter for immunization: Secondary | ICD-10-CM | POA: Diagnosis not present

## 2019-06-13 DIAGNOSIS — R7301 Impaired fasting glucose: Secondary | ICD-10-CM | POA: Diagnosis not present

## 2019-06-13 DIAGNOSIS — E041 Nontoxic single thyroid nodule: Secondary | ICD-10-CM | POA: Diagnosis not present

## 2019-06-13 DIAGNOSIS — I10 Essential (primary) hypertension: Secondary | ICD-10-CM | POA: Diagnosis not present

## 2019-06-13 DIAGNOSIS — Z0000 Encounter for general adult medical examination without abnormal findings: Secondary | ICD-10-CM | POA: Diagnosis not present

## 2019-06-13 DIAGNOSIS — Z125 Encounter for screening for malignant neoplasm of prostate: Secondary | ICD-10-CM | POA: Diagnosis not present

## 2019-06-18 DIAGNOSIS — R82998 Other abnormal findings in urine: Secondary | ICD-10-CM | POA: Diagnosis not present

## 2019-06-20 DIAGNOSIS — I10 Essential (primary) hypertension: Secondary | ICD-10-CM | POA: Diagnosis not present

## 2019-06-20 DIAGNOSIS — I2510 Atherosclerotic heart disease of native coronary artery without angina pectoris: Secondary | ICD-10-CM | POA: Diagnosis not present

## 2019-06-20 DIAGNOSIS — R7301 Impaired fasting glucose: Secondary | ICD-10-CM | POA: Diagnosis not present

## 2019-06-20 DIAGNOSIS — E785 Hyperlipidemia, unspecified: Secondary | ICD-10-CM | POA: Diagnosis not present

## 2019-06-20 DIAGNOSIS — Z0000 Encounter for general adult medical examination without abnormal findings: Secondary | ICD-10-CM | POA: Diagnosis not present

## 2019-06-20 DIAGNOSIS — Z1331 Encounter for screening for depression: Secondary | ICD-10-CM | POA: Diagnosis not present

## 2019-06-24 ENCOUNTER — Other Ambulatory Visit: Payer: Self-pay | Admitting: Internal Medicine

## 2019-06-24 DIAGNOSIS — R918 Other nonspecific abnormal finding of lung field: Secondary | ICD-10-CM

## 2019-06-26 ENCOUNTER — Encounter: Payer: Self-pay | Admitting: Cardiology

## 2019-07-07 ENCOUNTER — Ambulatory Visit: Payer: BC Managed Care – PPO | Admitting: Cardiology

## 2019-07-24 DIAGNOSIS — Z961 Presence of intraocular lens: Secondary | ICD-10-CM | POA: Diagnosis not present

## 2019-07-24 DIAGNOSIS — Z0100 Encounter for examination of eyes and vision without abnormal findings: Secondary | ICD-10-CM | POA: Diagnosis not present

## 2019-08-06 ENCOUNTER — Other Ambulatory Visit: Payer: BLUE CROSS/BLUE SHIELD

## 2019-08-06 ENCOUNTER — Ambulatory Visit
Admission: RE | Admit: 2019-08-06 | Discharge: 2019-08-06 | Disposition: A | Discharge status: Home / Self Care | Payer: BLUE CROSS/BLUE SHIELD | Source: Ambulatory Visit | Attending: Internal Medicine | Admitting: Internal Medicine

## 2019-08-06 DIAGNOSIS — R918 Other nonspecific abnormal finding of lung field: Secondary | ICD-10-CM

## 2020-06-27 IMAGING — CT CT CHEST W/O CM
2 of 4 series · 14 of 36 positions shown, 17 images · non-contrast
Comparison: 06/24/2018 cardiac CT

CLINICAL DATA: Follow-up nodules

EXAM:
CT CHEST WITHOUT CONTRAST
TECHNIQUE: Multidetector CT imaging of the chest was performed following the
standard protocol without IV contrast.

[Series 2: chest 2.00 br40 s3 · axial · 0.69mm/px · z∈[+1509,+1756]mm · 11 of 148 slices shown, 14 images (1 of 2)]
[im 12/148  mediastinal]
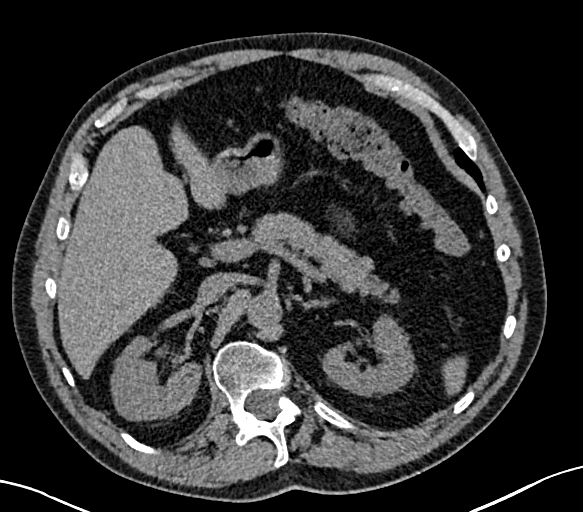
[im 12/148  lung]
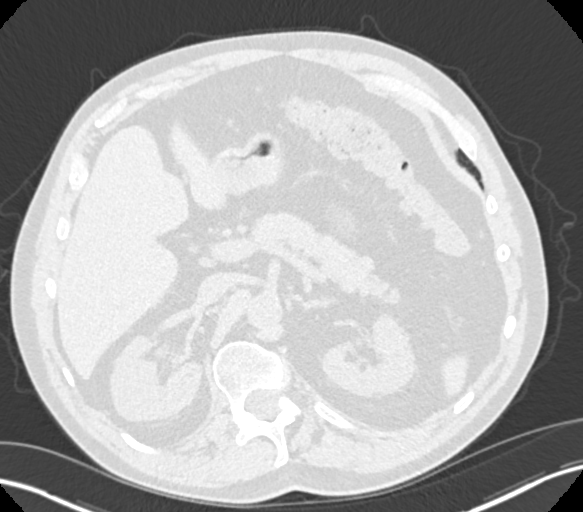
[im 23/148  lung]
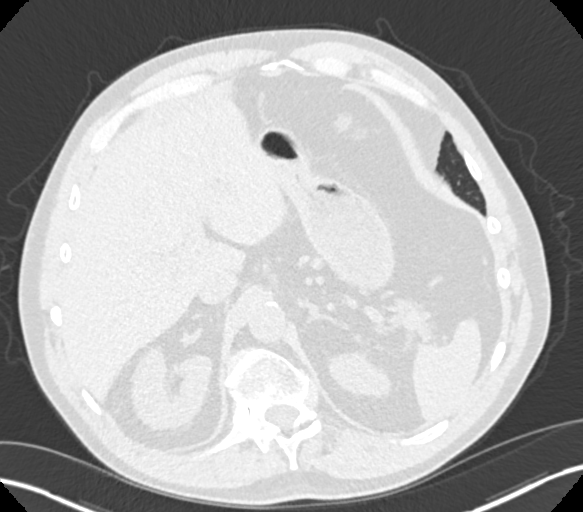
[im 34/148  lung]
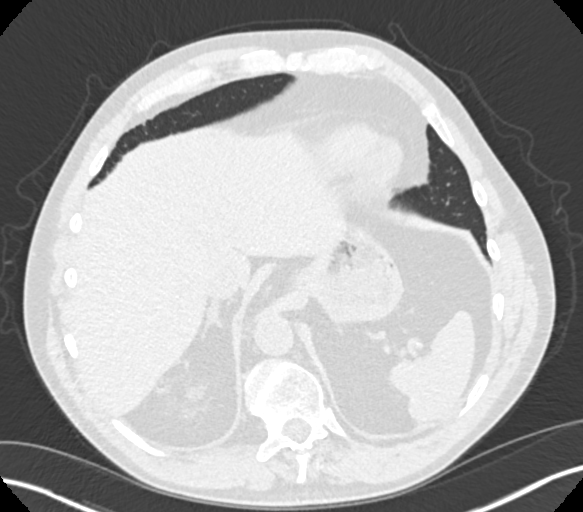
[im 46/148  lung]
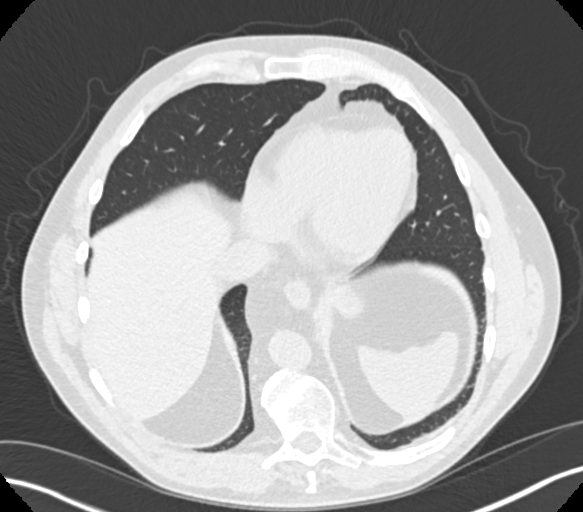
[im 57/148  mediastinal]
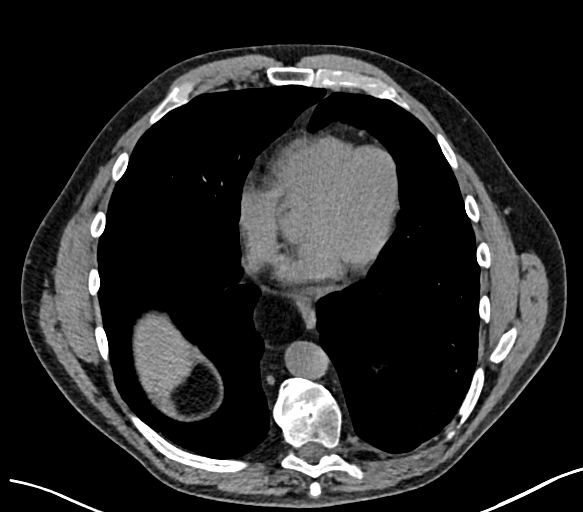
[im 57/148  lung]
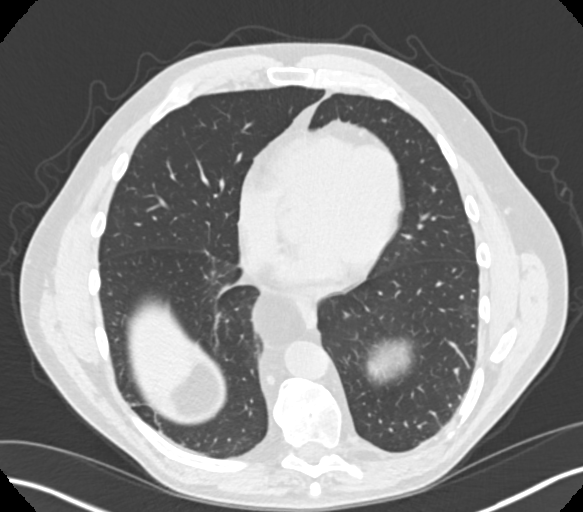
[im 80/148  lung]
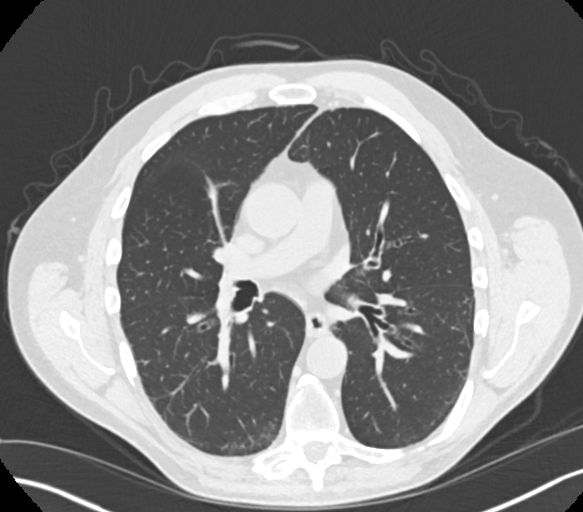
[im 91/148  lung]
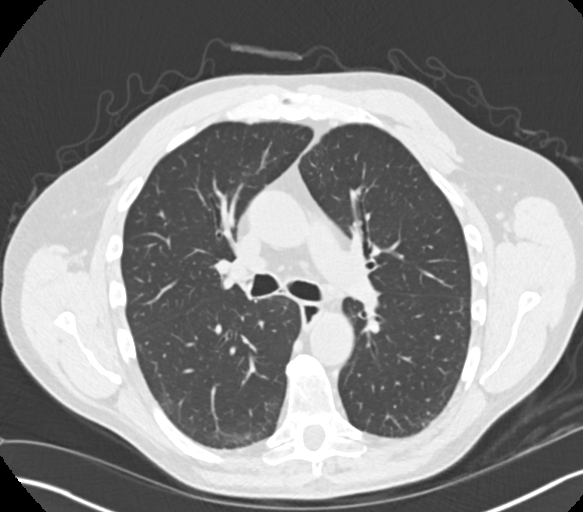
[im 102/148  lung]
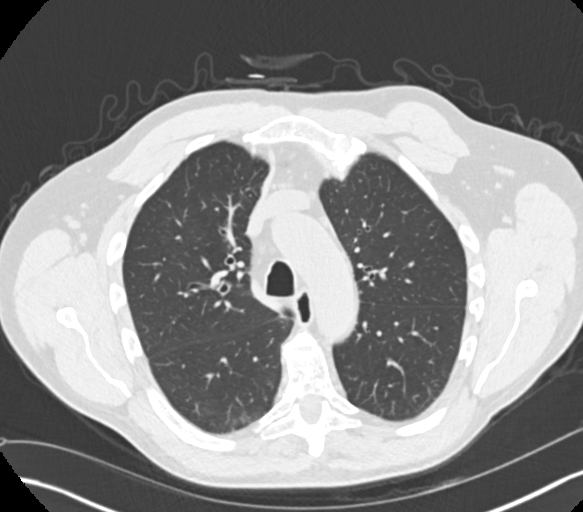
[im 114/148  mediastinal]
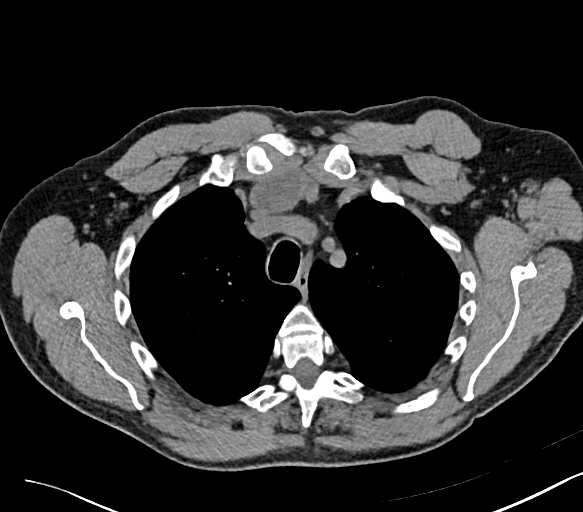
[im 114/148  lung]
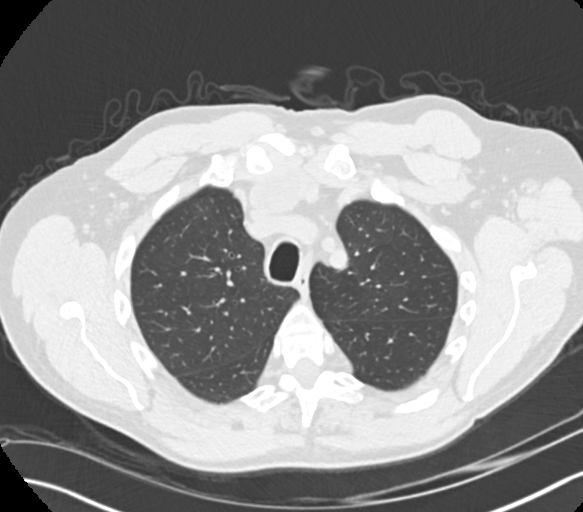
[im 125/148  lung]
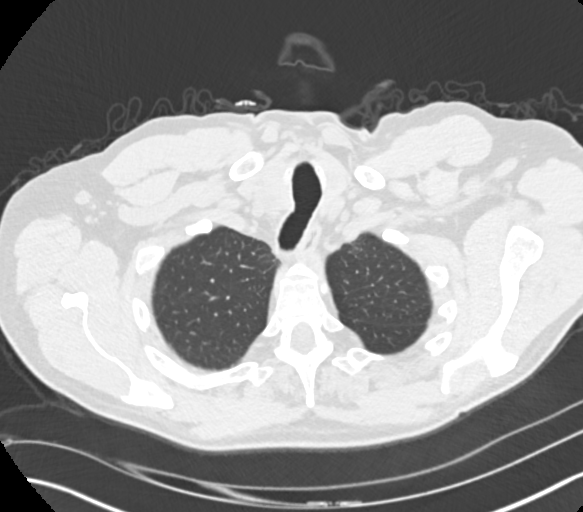
[im 136/148  lung]
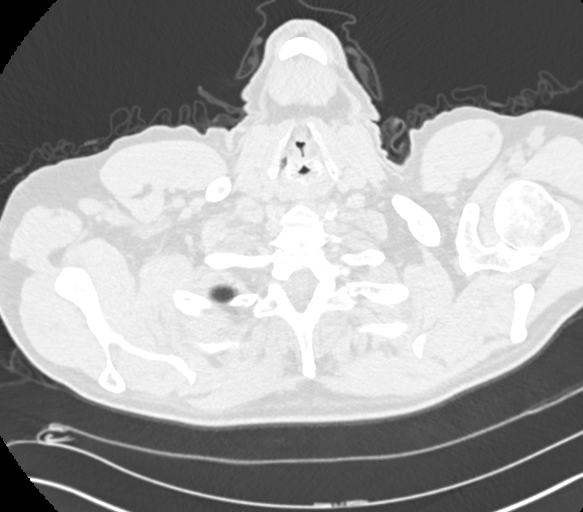

[Series 4: chest 2.00 br40 s3 · coronal · 0.58mm/px · 3 of 175 slices shown (2 of 2)]
[im 35/175  lung]
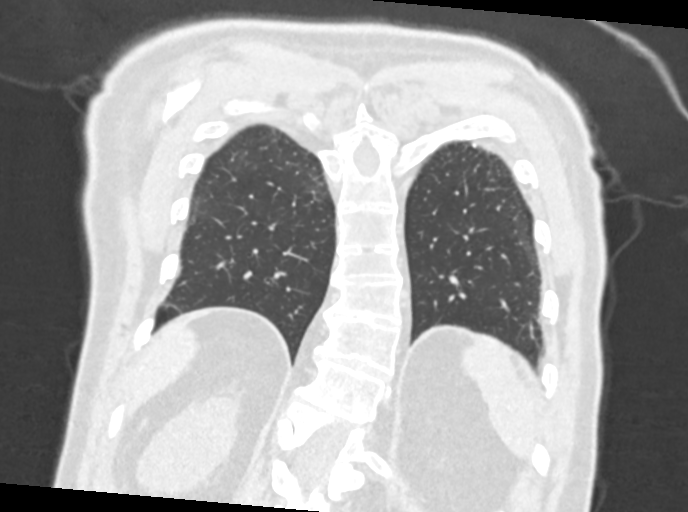
[im 70/175  lung]
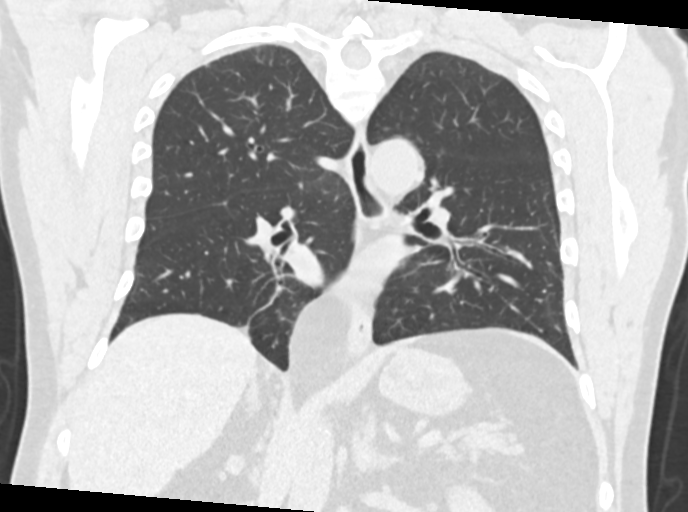
[im 105/175  lung]
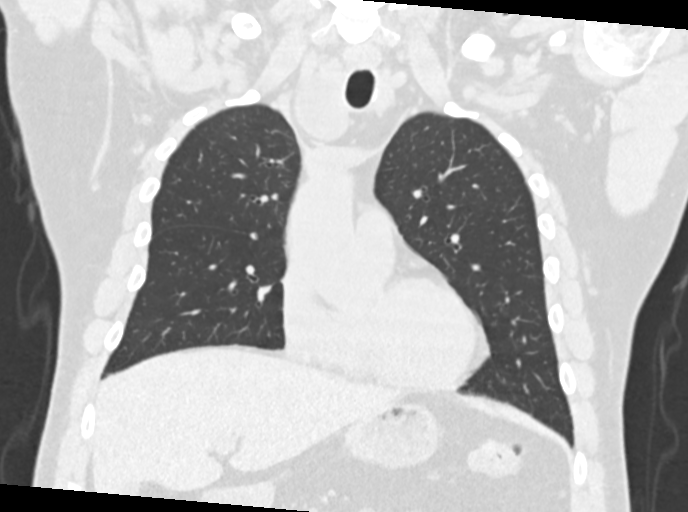

[14 of 36 positions shown; findings below may reference images not displayed]

FINDINGS: Cardiovascular: Normal heart size.  No pericardial effusion.

Mediastinum/Nodes: No mediastinal or axillary adenopathy.

Lungs/Pleura: A few punctate nodules are again identified. These are
unchanged from the prior study and some appear to be calcified. No
consolidation. No pleural effusion.

Upper Abdomen: No acute abnormality.

Musculoskeletal: Multilevel thoracic body wedging with resulting
accentuation of kyphosis. Multilevel degenerative changes are
present.
IMPRESSION: Stable few punctate pulmonary nodules. No further follow-up is
necessary.

## 2020-12-14 DIAGNOSIS — Z7982 Long term (current) use of aspirin: Secondary | ICD-10-CM | POA: Diagnosis not present

## 2020-12-14 DIAGNOSIS — Z800 Family history of malignant neoplasm of digestive organs: Secondary | ICD-10-CM | POA: Diagnosis not present

## 2020-12-14 DIAGNOSIS — E785 Hyperlipidemia, unspecified: Secondary | ICD-10-CM | POA: Diagnosis not present

## 2020-12-14 DIAGNOSIS — N400 Enlarged prostate without lower urinary tract symptoms: Secondary | ICD-10-CM | POA: Diagnosis not present

## 2020-12-14 DIAGNOSIS — Z791 Long term (current) use of non-steroidal anti-inflammatories (NSAID): Secondary | ICD-10-CM | POA: Diagnosis not present

## 2020-12-14 DIAGNOSIS — G8929 Other chronic pain: Secondary | ICD-10-CM | POA: Diagnosis not present

## 2020-12-14 DIAGNOSIS — Z803 Family history of malignant neoplasm of breast: Secondary | ICD-10-CM | POA: Diagnosis not present

## 2020-12-14 DIAGNOSIS — N529 Male erectile dysfunction, unspecified: Secondary | ICD-10-CM | POA: Diagnosis not present

## 2020-12-14 DIAGNOSIS — I10 Essential (primary) hypertension: Secondary | ICD-10-CM | POA: Diagnosis not present

## 2020-12-14 DIAGNOSIS — M1990 Unspecified osteoarthritis, unspecified site: Secondary | ICD-10-CM | POA: Diagnosis not present

## 2021-05-03 DIAGNOSIS — R052 Subacute cough: Secondary | ICD-10-CM | POA: Diagnosis not present

## 2021-05-03 DIAGNOSIS — R1013 Epigastric pain: Secondary | ICD-10-CM | POA: Diagnosis not present

## 2021-05-03 DIAGNOSIS — I10 Essential (primary) hypertension: Secondary | ICD-10-CM | POA: Diagnosis not present

## 2021-05-23 DIAGNOSIS — R1013 Epigastric pain: Secondary | ICD-10-CM | POA: Diagnosis not present

## 2021-07-04 DIAGNOSIS — Z23 Encounter for immunization: Secondary | ICD-10-CM | POA: Diagnosis not present

## 2021-07-28 DIAGNOSIS — E785 Hyperlipidemia, unspecified: Secondary | ICD-10-CM | POA: Diagnosis not present

## 2021-07-28 DIAGNOSIS — R7301 Impaired fasting glucose: Secondary | ICD-10-CM | POA: Diagnosis not present

## 2021-07-28 DIAGNOSIS — I10 Essential (primary) hypertension: Secondary | ICD-10-CM | POA: Diagnosis not present

## 2021-07-28 DIAGNOSIS — E039 Hypothyroidism, unspecified: Secondary | ICD-10-CM | POA: Diagnosis not present

## 2021-07-28 DIAGNOSIS — Z125 Encounter for screening for malignant neoplasm of prostate: Secondary | ICD-10-CM | POA: Diagnosis not present

## 2021-08-05 DIAGNOSIS — E039 Hypothyroidism, unspecified: Secondary | ICD-10-CM | POA: Diagnosis not present

## 2021-08-05 DIAGNOSIS — E041 Nontoxic single thyroid nodule: Secondary | ICD-10-CM | POA: Diagnosis not present

## 2021-08-05 DIAGNOSIS — I2510 Atherosclerotic heart disease of native coronary artery without angina pectoris: Secondary | ICD-10-CM | POA: Diagnosis not present

## 2021-08-05 DIAGNOSIS — I209 Angina pectoris, unspecified: Secondary | ICD-10-CM | POA: Diagnosis not present

## 2021-08-05 DIAGNOSIS — Z0000 Encounter for general adult medical examination without abnormal findings: Secondary | ICD-10-CM | POA: Diagnosis not present

## 2021-08-05 DIAGNOSIS — N400 Enlarged prostate without lower urinary tract symptoms: Secondary | ICD-10-CM | POA: Diagnosis not present

## 2021-08-05 DIAGNOSIS — R7301 Impaired fasting glucose: Secondary | ICD-10-CM | POA: Diagnosis not present

## 2021-08-05 DIAGNOSIS — E785 Hyperlipidemia, unspecified: Secondary | ICD-10-CM | POA: Diagnosis not present

## 2021-08-05 DIAGNOSIS — R82998 Other abnormal findings in urine: Secondary | ICD-10-CM | POA: Diagnosis not present

## 2021-08-05 DIAGNOSIS — K219 Gastro-esophageal reflux disease without esophagitis: Secondary | ICD-10-CM | POA: Diagnosis not present

## 2021-08-05 DIAGNOSIS — I10 Essential (primary) hypertension: Secondary | ICD-10-CM | POA: Diagnosis not present

## 2021-08-19 ENCOUNTER — Encounter (HOSPITAL_COMMUNITY): Payer: Self-pay

## 2021-08-23 NOTE — Progress Notes (Signed)
Primary Physician/Referring:  Ginger Organ., MD  Patient ID: Cameron Obrien, male    DOB: 08-Nov-1955, 66 y.o.   MRN: [id number]  Chief Complaint  Patient presents with   Chest Pain    Referred by Marton Redwood, MD   HPI:    Cameron Obrien  is a 66 y.o. Caucasian male patient with hypertension, hyperlipidemia, elevated coronary calcium score of 144 in 2019 placing him in the 69th percentile and multiple tiny pulmonary nodules felt to be low risk, last seen by me on 03/28/2019, low risk nuclear stress test and essentially normal echocardiogram with grade 1 diastolic dysfunction at that time.  He is now referred back to me for recurrence of chest pain.  Patient has been having worsening symptoms of chest discomfort that started about 6 to 8 months ago, has been getting worse over time.  Most of the episodes are occurring at night when he is laying down or when he is resting, he continues to be physically active and has not noticed any exertional component to his chest pain.  He was started on omeprazole and he saw significant improvement in chest pain however still has residual chest discomfort hence referred to me for evaluation.  Past Medical History:  Diagnosis Date   Allergy    CAD (coronary artery disease)    Cataract    Essential hypertension 01/02/2019   Hyperlipidemia    Seizures ([facility])    at age 16- none since then   Substance abuse ([place])    hx marijuana used   Past Surgical History:  Procedure Laterality Date   COLONOSCOPY     Family History  Problem Relation Age of Onset   Colon cancer Mother        at age 34, also hx of lung CA   Breast cancer Mother    Heart attack Father 68   Heart disease Father    Esophageal cancer Father    Leukemia Maternal Aunt    Rectal cancer Neg Hx    Stomach cancer Neg Hx     Social History   Tobacco Use   Smoking status: Former   Smokeless tobacco: Never   Tobacco comments:    "as a kid"  Substance Use Topics   Alcohol  use: Yes    Comment: occasionally   Marital Status: Single  ROS  Review of Systems  Cardiovascular:  Positive for chest pain. Negative for dyspnea on exertion and leg swelling.  Gastrointestinal:  Negative for melena.  Objective  Blood pressure (!) 156/95, pulse 87, temperature 98.7 F (37.1 C), temperature source Temporal, resp. rate 16, height 6' (1.829 m), weight 195 lb 6.4 oz (88.6 kg), SpO2 97 %. Body mass index is 26.5 kg/m.  Vitals with BMI 08/24/2021 03/28/2019 01/02/2019  Height '6\' 0"'  '6\' 0"'  -  Weight 195 lbs 6 oz 187 lbs -  BMI 82.0 60.15 -  Systolic 615 379 432  Diastolic 95 83 98  Pulse 87 72 84    Physical Exam Neck:     Vascular: No carotid bruit or JVD.  Cardiovascular:     Rate and Rhythm: Normal rate and regular rhythm.     Pulses: Intact distal pulses.     Heart sounds: Normal heart sounds. No murmur heard.   No gallop.  Pulmonary:     Effort: Pulmonary effort is normal.     Breath sounds: Normal breath sounds.  Abdominal:     General: Bowel sounds are normal.  Palpations: Abdomen is soft.  Musculoskeletal:        General: No swelling.     Laboratory examination:   External labs:   Labs 07/28/2021:  Serum glucose 1 8 mg, BUN 12, creatinine 1.0, EGFR 75 mill, potassium 4.1, CMP normal.  Hb 13.5/HCT 39.1, platelets 219.  Total cholesterol 142, triglycerides 132, HDL 42, LDL 74.  Non-HDL cholesterol 100.  TSH mildly elevated at 6.96.  Free T4 normal.  A1c 5.3%.  Medications and allergies   Allergies  Allergen Reactions   Amlodipine     Other reaction(s): rash     Medication prior to this encounter:   Outpatient Medications Prior to Visit  Medication Sig Dispense Refill   aspirin EC 81 MG tablet Take 81 mg by mouth daily.     clobetasol cream (TEMOVATE) 0.93 % Apply 1 application topically 2 (two) times daily as needed.     diphenhydrAMINE (BENADRYL) 25 mg capsule Take 25 mg by mouth at bedtime as needed.     Multiple Vitamins-Minerals  ([employer] DAILY MULTIPLE FOR MEN 50+ PO) Take 1 tablet by mouth daily.     olmesartan (BENICAR) 20 MG tablet Take 20 mg by mouth daily.     omeprazole (PRILOSEC) 40 MG capsule Take 40 mg by mouth daily.     rosuvastatin (CRESTOR) 20 MG tablet Take 1 tablet (20 mg total) by mouth daily. 30 tablet 2   tamsulosin (FLOMAX) 0.4 MG CAPS capsule Take 0.4 mg by mouth daily.     potassium chloride SA (K-DUR) 20 MEQ tablet Take 20 mEq by mouth 2 (two) times daily.     olmesartan-hydrochlorothiazide (BENICAR HCT) 20-12.5 MG tablet Take 1 tablet by mouth every morning. (Patient taking differently: Take by mouth every morning. 1/2 tablet) 30 tablet 2   Facility-Administered Medications Prior to Visit  Medication Dose Route Frequency Provider Last Rate Last Admin   0.9 %  sodium chloride infusion  500 mL Intravenous Continuous Irene Shipper, MD         Medication list after today's encounter   Current Outpatient Medications  Medication Instructions   aspirin EC 81 mg, Oral, Daily   clobetasol cream (TEMOVATE) 8.18 % 1 application, Topical, 2 times daily PRN   diphenhydrAMINE (BENADRYL) 25 mg, Oral, At bedtime PRN   Multiple Vitamins-Minerals ([employer] DAILY MULTIPLE FOR MEN 50+ PO) 1 tablet, Oral, Daily   olmesartan (BENICAR) 20 mg, Oral, Daily   omeprazole (PRILOSEC) 40 mg, Oral, Daily   rosuvastatin (CRESTOR) 20 mg, Oral, Daily   spironolactone (ALDACTONE) 25 mg, Oral, BH-each morning   tamsulosin (FLOMAX) 0.4 mg, Oral, Daily    Radiology:   Coronary calcium score 06/24/2018:  Total Agatson score 144, 69% MESA database percentile for age, race and sex matched individual.  Multiple tiny pulmonary nodules.  CT angiogram chest 08/06/2019: Stable few punctate pulmonary nodules, no further follow-up needed.  Visualized cardiac structures appear normal.  Cardiac Studies:   [facility] ECHOCARDIOGRAM COMPLETE 03/24/2019  Narrative Echocardiogram 03/24/2019: Left ventricle cavity is normal in size. Mild  concentric hypertrophy of the left ventricle. Normal global wall motion. Normal LV systolic function with EF 55%. Doppler evidence of grade I (impaired) diastolic dysfunction, normal LAP. Mild (Grade I) mitral regurgitation. Mild tricuspid regurgitation. No evidence of pulmonary hypertension.    [facility] MYOCARDIAL PERFUSION WITH LEXISCAN 03/05/2019  Narrative Lexiscan Sestamibi Stress Test 03/05/2019: Stress EKG is non-diagnostic due to pharmacologic stress testing.  Patient remained asymptomatic. Perfusion study demonstrates a small to medium-sized defect in  the inferior wall extending from base to the apex suggestive of diaphragmatic attenuation.  Mild ischemia in the same region cannot be completely excluded. All segments of left ventricle demonstrated normal wall motion and thickening. Stress LV EF is normal 55%. Low risk study.  US abdomen 07/23/2021: Normal ultrasound of the upper abdomen.  Generalized abdominal aorta is normal.  EKG:   EKG 08/24/2021: Normal sinus rhythm at rate of 86 bpm, normal axis.  No evidence of ischemia, normal EKG.    Assessment     ICD-10-CM   1. Gastroesophageal reflux disease without esophagitis  K21.9 EKG 12-Lead    2. Elevated coronary artery calcium score 69 percentile on 06/24/2018  R93.1     3. Primary hypertension  T77 Basic metabolic panel    4. Hypercholesteremia  E78.00        Medications Discontinued During This Encounter  Medication Reason   olmesartan-hydrochlorothiazide (BENICAR HCT) 20-12.5 MG tablet Change in therapy   potassium chloride SA (K-DUR) 20 MEQ tablet Discontinued by provider    Meds ordered this encounter  Medications   spironolactone (ALDACTONE) 25 MG tablet    Sig: Take 1 tablet (25 mg total) by mouth every morning.    Dispense:  30 tablet    Refill:  2   Orders Placed This Encounter  Procedures   Basic metabolic panel   EKG 11-AFBX   Recommendations:   Cameron Obrien is a 66 y.o. Caucasian male patient  with hypertension, hyperlipidemia, elevated coronary calcium score of 144 in 2019 placing him in the 69th percentile and multiple tiny pulmonary nodules felt to be low risk, last seen by me on 03/28/2019, low risk nuclear stress test and essentially normal echocardiogram with grade 1 diastolic dysfunction at that time.  He is now referred back to me for recurrence of chest pain.  Patient referred to me for evaluation of chest pain, retrosternal chest pain.  Mostly occurring at night or when he is resting and no relationship to exertional activity.  Symptoms have improved with being on omeprazole.  I suspect GERD to be the etiology.  He has historically been hypokalemic and hence has been on fairly large supplements of potassium 20 mEq twice daily.  I have discontinued this which could be contributing to his GERD, I will start him on spironolactone 25 mg in the morning, will obtain a BMP in 2 weeks.  I would like to see him back in 4 weeks both for hypertension management and to follow-up on his labs, if he remains stable I will see him back on a as needed basis.  In spite of this he continues to have chest discomfort and I may consider repeating a stress test.  Otherwise I reviewed his previously performed tests, within normal limits, I also evaluated his external records, lipids are under excellent control.  His blood pressure was elevated today hopefully by addition of spironolactone his blood pressure will be much better improved as well.  I reassured him.    Adrian Prows, MD, [facility] 08/24/2021, 11:10 AM Office: [phone]

## 2021-08-24 ENCOUNTER — Other Ambulatory Visit: Payer: Self-pay | Admitting: Cardiology

## 2021-08-24 ENCOUNTER — Ambulatory Visit: Payer: Medicare HMO | Admitting: Cardiology

## 2021-08-24 ENCOUNTER — Other Ambulatory Visit: Payer: Self-pay

## 2021-08-24 ENCOUNTER — Encounter: Payer: Self-pay | Admitting: Cardiology

## 2021-08-24 VITALS — BP 156/95 | HR 87 | Temp 98.7°F | Resp 16 | Ht 72.0 in | Wt 195.4 lb

## 2021-08-24 DIAGNOSIS — R0789 Other chest pain: Secondary | ICD-10-CM

## 2021-08-24 DIAGNOSIS — E7800 Pure hypercholesterolemia, unspecified: Secondary | ICD-10-CM | POA: Diagnosis not present

## 2021-08-24 DIAGNOSIS — K219 Gastro-esophageal reflux disease without esophagitis: Secondary | ICD-10-CM | POA: Diagnosis not present

## 2021-08-24 DIAGNOSIS — R931 Abnormal findings on diagnostic imaging of heart and coronary circulation: Secondary | ICD-10-CM | POA: Diagnosis not present

## 2021-08-24 DIAGNOSIS — I10 Essential (primary) hypertension: Secondary | ICD-10-CM | POA: Diagnosis not present

## 2021-08-24 MED ORDER — SPIRONOLACTONE 25 MG PO TABS: 25.0000 mg | ORAL_TABLET | ORAL | 2 refills | Status: DC

## 2021-08-29 DIAGNOSIS — Z961 Presence of intraocular lens: Secondary | ICD-10-CM | POA: Diagnosis not present

## 2021-08-29 DIAGNOSIS — H524 Presbyopia: Secondary | ICD-10-CM | POA: Diagnosis not present

## 2021-08-29 DIAGNOSIS — H52201 Unspecified astigmatism, right eye: Secondary | ICD-10-CM | POA: Diagnosis not present

## 2021-08-29 DIAGNOSIS — H5202 Hypermetropia, left eye: Secondary | ICD-10-CM | POA: Diagnosis not present

## 2021-08-30 ENCOUNTER — Other Ambulatory Visit (HOSPITAL_COMMUNITY): Payer: Self-pay | Admitting: Internal Medicine

## 2021-08-30 DIAGNOSIS — Z136 Encounter for screening for cardiovascular disorders: Secondary | ICD-10-CM

## 2021-09-05 ENCOUNTER — Ambulatory Visit (HOSPITAL_COMMUNITY)
Admission: RE | Admit: 2021-09-05 | Discharge: 2021-09-05 | Disposition: A | Discharge status: Home / Self Care | Payer: Medicare HMO | Source: Ambulatory Visit | Attending: Internal Medicine | Admitting: Internal Medicine

## 2021-09-05 ENCOUNTER — Other Ambulatory Visit: Payer: Self-pay

## 2021-09-05 DIAGNOSIS — Z87891 Personal history of nicotine dependence: Secondary | ICD-10-CM | POA: Insufficient documentation

## 2021-09-05 DIAGNOSIS — E785 Hyperlipidemia, unspecified: Secondary | ICD-10-CM | POA: Diagnosis not present

## 2021-09-05 DIAGNOSIS — I10 Essential (primary) hypertension: Secondary | ICD-10-CM | POA: Diagnosis not present

## 2021-09-05 DIAGNOSIS — Z136 Encounter for screening for cardiovascular disorders: Secondary | ICD-10-CM | POA: Diagnosis not present

## 2021-09-06 LAB — BASIC METABOLIC PANEL
BUN/Creatinine Ratio: 10 (ref 10–24)
BUN: 12 mg/dL (ref 8–27)
CO2: 21 mmol/L (ref 20–29)
Calcium: 9.2 mg/dL (ref 8.6–10.2)
Chloride: 106 mmol/L (ref 96–106)
Creatinine, Ser: 1.21 mg/dL (ref 0.76–1.27)
Glucose: 103 mg/dL — ABNORMAL HIGH (ref 70–99)
Potassium: 4.1 mmol/L (ref 3.5–5.2)
Sodium: 142 mmol/L (ref 134–144)
eGFR: 66 mL/min/{1.73_m2} (ref >59)

## 2021-09-20 NOTE — Progress Notes (Signed)
Primary Physician/Referring:  Ginger Organ., MD  Patient ID: Cameron Obrien, male    DOB: [birth year], 66 y.o.   MRN: [id number]  Chief Complaint  Patient presents with   Hypertension    4 WEEKS   Results   HPI:    Cameron Obrien  is a 66 y.o. Caucasian male patient with hypertension, hyperlipidemia, elevated coronary calcium score of 144 in 2019 placing him in the 69th percentile and multiple tiny pulmonary nodules felt to be low risk, last seen by me on 03/28/2019, low risk nuclear stress test and essentially normal echocardiogram with grade 1 diastolic dysfunction at that time.  Patient was then referred back due to recurrence of chest pain.  Patient presents for 4-week follow-up.  At last office visit patient's chest pain symptoms had significantly improved with the addition of omeprazole, therefore it was suspected the etiology was GERD.  Patient was previously on large supplements of potassium which Dr. Einar Gip felt was likely contributing to GERD, therefore stopped potassium supplements and started him on spironolactone 25 mg in the morning, repeat BMP remained stable.  Patient now presents for follow-up of blood pressure management and GERD.   Patient has had no recurrence of chest pain since stopping potassium supplements. He is tolerating spironolactone without issue and blood pressure control has improved. Patient does admit there is room for improvement in his diet regarding sodium intake.  Patient is overall feeling well without concerns today.   Past Medical History:  Diagnosis Date   Allergy    CAD (coronary artery disease)    Cataract    Essential hypertension 01/02/2019   Hyperlipidemia    Seizures (HCC)    at age 16- none since then   Substance abuse ([place])    hx marijuana used   Family History  Problem Relation Age of Onset   Colon cancer Mother        at age 76, also hx of lung CA   Breast cancer Mother    Heart attack Father 85   Heart disease Father     Esophageal cancer Father    Leukemia Maternal Aunt    Rectal cancer Neg Hx    Stomach cancer Neg Hx     Social History   Tobacco Use   Smoking status: Never   Smokeless tobacco: Never   Tobacco comments:    "as a kid"  Substance Use Topics   Alcohol use: Yes    Comment: occasionally   Marital Status: Single  ROS  Review of Systems  Cardiovascular:  Negative for chest pain (resolved), dyspnea on exertion and leg swelling.  Gastrointestinal:  Negative for melena.   Objective  Blood pressure 140/85, pulse 81, temperature 98.1 F (36.7 C), temperature source Temporal, resp. rate 17, height 6' (1.829 m), weight 193 lb 9.6 oz (87.8 kg), SpO2 95 %. Body mass index is 26.26 kg/m.  Vitals with BMI 09/21/2021 09/21/2021 08/24/2021  Height - 6' 0" 6' 0"  Weight - 193 lbs 10 oz 195 lbs 6 oz  BMI - 60.10 93.2  Systolic 355 732 202  Diastolic 85 95 95  Pulse 81 84 87    Physical Exam Neck:     Vascular: No carotid bruit or JVD.  Cardiovascular:     Rate and Rhythm: Normal rate and regular rhythm.     Pulses: Intact distal pulses.     Heart sounds: Normal heart sounds. No murmur heard.   No gallop.  Pulmonary:  Effort: Pulmonary effort is normal.     Breath sounds: Normal breath sounds.  Abdominal:     General: Bowel sounds are normal.     Palpations: Abdomen is soft.  Musculoskeletal:        General: No swelling.  Physical exam unchanged compared to previous office visit.    Laboratory examination:   CMP Latest Ref Rng & Units 09/05/2021  Glucose 70 - 99 mg/dL 103(H)  BUN 8 - 27 mg/dL 12  Creatinine 0.76 - 1.27 mg/dL 1.21  Sodium 134 - 144 mmol/L 142  Potassium 3.5 - 5.2 mmol/L 4.1  Chloride 96 - 106 mmol/L 106  CO2 20 - 29 mmol/L 21  Calcium 8.6 - 10.2 mg/dL 9.2   No flowsheet data found. Lipid Panel     Component Value Date/Time   CHOL 158 04/08/2019 0810   TRIG 230 (H) 04/08/2019 0810   HDL 49 04/08/2019 0810   CHOLHDL 3.2 04/08/2019 0810   LDLCALC 71  04/08/2019 0810   HEMOGLOBIN A1C No results found for: HGBA1C, MPG TSH No results for input(s): TSH in the last 8760 hours.  External labs:   Labs 07/28/2021:  Serum glucose 1 8 mg, BUN 12, creatinine 1.0, EGFR 75 mill, potassium 4.1, CMP normal.  Hb 13.5/HCT 39.1, platelets 219.  Total cholesterol 142, triglycerides 132, HDL 42, LDL 74.  Non-HDL cholesterol 100.  TSH mildly elevated at 6.96.  Free T4 normal.  A1c 5.3%.  Medications and allergies   Allergies  Allergen Reactions   Amlodipine     Other reaction(s): rash     Medication prior to this encounter:   Outpatient Medications Prior to Visit  Medication Sig Dispense Refill   aspirin EC 81 MG tablet Take 81 mg by mouth daily.     clobetasol cream (TEMOVATE) 8.41 % Apply 1 application topically 2 (two) times daily as needed.     diphenhydrAMINE (BENADRYL) 25 mg capsule Take 25 mg by mouth at bedtime as needed.     Multiple Vitamins-Minerals ([employer] DAILY MULTIPLE FOR MEN 50+ PO) Take 1 tablet by mouth daily.     olmesartan (BENICAR) 20 MG tablet Take 20 mg by mouth daily.     omeprazole (PRILOSEC) 40 MG capsule Take 40 mg by mouth daily.     rosuvastatin (CRESTOR) 20 MG tablet Take 1 tablet (20 mg total) by mouth daily. 30 tablet 2   spironolactone (ALDACTONE) 25 MG tablet TAKE 1 TABLET BY MOUTH EVERY DAY IN THE MORNING 90 tablet 1   tamsulosin (FLOMAX) 0.4 MG CAPS capsule Take 0.4 mg by mouth daily.     Facility-Administered Medications Prior to Visit  Medication Dose Route Frequency Provider Last Rate Last Admin   0.9 %  sodium chloride infusion  500 mL Intravenous Continuous Irene Shipper, MD         Medication list after today's encounter   Current Outpatient Medications  Medication Instructions   aspirin EC 81 mg, Oral, Daily   clobetasol cream (TEMOVATE) 6.60 % 1 application, Topical, 2 times daily PRN   diphenhydrAMINE (BENADRYL) 25 mg, Oral, At bedtime PRN   Multiple Vitamins-Minerals ([employer] DAILY MULTIPLE  FOR MEN 50+ PO) 1 tablet, Oral, Daily   olmesartan (BENICAR) 20 mg, Oral, Daily   omeprazole (PRILOSEC) 40 mg, Oral, Daily   rosuvastatin (CRESTOR) 20 mg, Oral, Daily   spironolactone (ALDACTONE) 25 MG tablet TAKE 1 TABLET BY MOUTH EVERY DAY IN THE MORNING   tamsulosin (FLOMAX) 0.4 mg, Oral, Daily  Radiology:   Coronary calcium score 06/24/2018:  Total Agatson score 144, 69% MESA database percentile for age, race and sex matched individual.  Multiple tiny pulmonary nodules.  CT angiogram chest 08/06/2019: Stable few punctate pulmonary nodules, no further follow-up needed.  Visualized cardiac structures appear normal.  Cardiac Studies:   [facility] ECHOCARDIOGRAM COMPLETE 03/24/2019 Left ventricle cavity is normal in size. Mild concentric hypertrophy of the left ventricle. Normal global wall motion. Normal LV systolic function with EF 55%. Doppler evidence of grade I (impaired) diastolic dysfunction, normal LAP. Mild (Grade I) mitral regurgitation. Mild tricuspid regurgitation. No evidence of pulmonary hypertension.    [facility] MYOCARDIAL PERFUSION WITH LEXISCAN 03/05/2019 Stress EKG is non-diagnostic due to pharmacologic stress testing.  Patient remained asymptomatic. Perfusion study demonstrates a small to medium-sized defect in the inferior wall extending from base to the apex suggestive of diaphragmatic attenuation.  Mild ischemia in the same region cannot be completely excluded. All segments of left ventricle demonstrated normal wall motion and thickening. Stress LV EF is normal 55%. Low risk study.  US abdomen 07/23/2021: Normal ultrasound of the upper abdomen.  Generalized abdominal aorta is normal.  EKG:   EKG 08/24/2021: Normal sinus rhythm at rate of 86 bpm, normal axis.  No evidence of ischemia, normal EKG.    Assessment     ICD-10-CM   1. Primary hypertension  I10     2. Gastroesophageal reflux disease without esophagitis  K21.9        There are no discontinued  medications.   No orders of the defined types were placed in this encounter.  No orders of the defined types were placed in this encounter.  Recommendations:   Cameron Obrien is a 66 y.o. Caucasian male patient with hypertension, hyperlipidemia, elevated coronary calcium score of 144 in 2019 placing him in the 69th percentile and multiple tiny pulmonary nodules felt to be low risk, last seen by me on 03/28/2019, low risk nuclear stress test and essentially normal echocardiogram with grade 1 diastolic dysfunction at that time.  He was then referred back due to recurrence of chest pain.   Patient presents for 4-week follow-up.  At last office visit patient's chest pain symptoms had significantly improved with the addition of omeprazole, therefore it was suspected the etiology was GERD.  Patient was previously on large supplements of potassium which Dr. Einar Gip felt was likely contributing to GERD, therefore stopped potassium supplements and started him on spironolactone 25 mg in the morning, repeat BMP remained stable.  Patient now presents for follow-up of blood pressure management and GERD.   Patient has had no recurrence of chest pain, likely this was related to GERD. Also patient blood pressure, although mildly elevated at today's office visit is within acceptable range today. Will continue spironolactone. No indication for further cardiac testing at this time. Will not make changes to medications at this time. Recommend DASH diet and that patient monitor his blood pressure at home regularly.   As patient is stable from a cardiology standpoint will see hi only as needed. Recommend continued primary prevention with PCP.    Alethia Berthold, PA-C 09/21/2021, 12:45 PM Office: [phone]

## 2021-09-21 ENCOUNTER — Ambulatory Visit: Payer: Medicare HMO | Admitting: Student

## 2021-09-21 ENCOUNTER — Encounter: Payer: Self-pay | Admitting: Student

## 2021-09-21 ENCOUNTER — Other Ambulatory Visit: Payer: Self-pay

## 2021-09-21 VITALS — BP 140/85 | HR 81 | Temp 98.1°F | Resp 17 | Ht 72.0 in | Wt 193.6 lb

## 2021-09-21 DIAGNOSIS — K219 Gastro-esophageal reflux disease without esophagitis: Secondary | ICD-10-CM | POA: Diagnosis not present

## 2021-09-21 DIAGNOSIS — I10 Essential (primary) hypertension: Secondary | ICD-10-CM | POA: Diagnosis not present

## 2021-10-19 DIAGNOSIS — I10 Essential (primary) hypertension: Secondary | ICD-10-CM | POA: Diagnosis not present

## 2021-10-19 DIAGNOSIS — M545 Low back pain, unspecified: Secondary | ICD-10-CM | POA: Diagnosis not present

## 2021-10-27 DIAGNOSIS — M1990 Unspecified osteoarthritis, unspecified site: Secondary | ICD-10-CM | POA: Diagnosis not present

## 2021-10-27 DIAGNOSIS — E785 Hyperlipidemia, unspecified: Secondary | ICD-10-CM | POA: Diagnosis not present

## 2021-10-27 DIAGNOSIS — K219 Gastro-esophageal reflux disease without esophagitis: Secondary | ICD-10-CM | POA: Diagnosis not present

## 2021-10-27 DIAGNOSIS — N400 Enlarged prostate without lower urinary tract symptoms: Secondary | ICD-10-CM | POA: Diagnosis not present

## 2021-10-27 DIAGNOSIS — N529 Male erectile dysfunction, unspecified: Secondary | ICD-10-CM | POA: Diagnosis not present

## 2021-10-27 DIAGNOSIS — Z008 Encounter for other general examination: Secondary | ICD-10-CM | POA: Diagnosis not present

## 2021-10-27 DIAGNOSIS — Z7982 Long term (current) use of aspirin: Secondary | ICD-10-CM | POA: Diagnosis not present

## 2021-10-27 DIAGNOSIS — I10 Essential (primary) hypertension: Secondary | ICD-10-CM | POA: Diagnosis not present

## 2021-10-27 DIAGNOSIS — Z791 Long term (current) use of non-steroidal anti-inflammatories (NSAID): Secondary | ICD-10-CM | POA: Diagnosis not present

## 2021-10-27 DIAGNOSIS — I2510 Atherosclerotic heart disease of native coronary artery without angina pectoris: Secondary | ICD-10-CM | POA: Diagnosis not present

## 2021-10-27 DIAGNOSIS — J309 Allergic rhinitis, unspecified: Secondary | ICD-10-CM | POA: Diagnosis not present

## 2021-10-27 DIAGNOSIS — G8929 Other chronic pain: Secondary | ICD-10-CM | POA: Diagnosis not present

## 2021-10-27 DIAGNOSIS — Z7722 Contact with and (suspected) exposure to environmental tobacco smoke (acute) (chronic): Secondary | ICD-10-CM | POA: Diagnosis not present

## 2022-07-17 ENCOUNTER — Other Ambulatory Visit: Payer: Self-pay

## 2022-07-17 MED ORDER — SPIRONOLACTONE 25 MG PO TABS: ORAL_TABLET | ORAL | 1 refills | Status: DC

## 2022-08-23 DIAGNOSIS — R300 Dysuria: Secondary | ICD-10-CM | POA: Diagnosis not present

## 2022-10-16 DIAGNOSIS — Z125 Encounter for screening for malignant neoplasm of prostate: Secondary | ICD-10-CM | POA: Diagnosis not present

## 2022-10-16 DIAGNOSIS — E039 Hypothyroidism, unspecified: Secondary | ICD-10-CM | POA: Diagnosis not present

## 2022-10-16 DIAGNOSIS — I10 Essential (primary) hypertension: Secondary | ICD-10-CM | POA: Diagnosis not present

## 2022-10-16 DIAGNOSIS — K219 Gastro-esophageal reflux disease without esophagitis: Secondary | ICD-10-CM | POA: Diagnosis not present

## 2022-10-16 DIAGNOSIS — E785 Hyperlipidemia, unspecified: Secondary | ICD-10-CM | POA: Diagnosis not present

## 2022-10-16 DIAGNOSIS — R7301 Impaired fasting glucose: Secondary | ICD-10-CM | POA: Diagnosis not present

## 2022-11-09 DIAGNOSIS — I10 Essential (primary) hypertension: Secondary | ICD-10-CM | POA: Diagnosis not present

## 2022-11-09 DIAGNOSIS — R300 Dysuria: Secondary | ICD-10-CM | POA: Diagnosis not present

## 2022-11-09 DIAGNOSIS — E785 Hyperlipidemia, unspecified: Secondary | ICD-10-CM | POA: Diagnosis not present

## 2022-11-09 DIAGNOSIS — N529 Male erectile dysfunction, unspecified: Secondary | ICD-10-CM | POA: Diagnosis not present

## 2022-11-09 DIAGNOSIS — R7301 Impaired fasting glucose: Secondary | ICD-10-CM | POA: Diagnosis not present

## 2022-11-09 DIAGNOSIS — Z1339 Encounter for screening examination for other mental health and behavioral disorders: Secondary | ICD-10-CM | POA: Diagnosis not present

## 2022-11-09 DIAGNOSIS — Z1331 Encounter for screening for depression: Secondary | ICD-10-CM | POA: Diagnosis not present

## 2022-11-09 DIAGNOSIS — Z8249 Family history of ischemic heart disease and other diseases of the circulatory system: Secondary | ICD-10-CM | POA: Diagnosis not present

## 2022-11-09 DIAGNOSIS — Z23 Encounter for immunization: Secondary | ICD-10-CM | POA: Diagnosis not present

## 2022-11-09 DIAGNOSIS — E039 Hypothyroidism, unspecified: Secondary | ICD-10-CM | POA: Diagnosis not present

## 2022-11-09 DIAGNOSIS — Z0000 Encounter for general adult medical examination without abnormal findings: Secondary | ICD-10-CM | POA: Diagnosis not present

## 2022-11-09 DIAGNOSIS — I2510 Atherosclerotic heart disease of native coronary artery without angina pectoris: Secondary | ICD-10-CM | POA: Diagnosis not present

## 2022-12-22 ENCOUNTER — Other Ambulatory Visit: Payer: Self-pay | Admitting: Cardiology

## 2023-01-22 DIAGNOSIS — E785 Hyperlipidemia, unspecified: Secondary | ICD-10-CM | POA: Diagnosis not present

## 2023-01-22 DIAGNOSIS — K219 Gastro-esophageal reflux disease without esophagitis: Secondary | ICD-10-CM | POA: Diagnosis not present

## 2023-01-22 DIAGNOSIS — N182 Chronic kidney disease, stage 2 (mild): Secondary | ICD-10-CM | POA: Diagnosis not present

## 2023-01-22 DIAGNOSIS — N401 Enlarged prostate with lower urinary tract symptoms: Secondary | ICD-10-CM | POA: Diagnosis not present

## 2023-01-22 DIAGNOSIS — N529 Male erectile dysfunction, unspecified: Secondary | ICD-10-CM | POA: Diagnosis not present

## 2023-01-22 DIAGNOSIS — Z8249 Family history of ischemic heart disease and other diseases of the circulatory system: Secondary | ICD-10-CM | POA: Diagnosis not present

## 2023-01-22 DIAGNOSIS — I129 Hypertensive chronic kidney disease with stage 1 through stage 4 chronic kidney disease, or unspecified chronic kidney disease: Secondary | ICD-10-CM | POA: Diagnosis not present

## 2023-01-22 DIAGNOSIS — I700 Atherosclerosis of aorta: Secondary | ICD-10-CM | POA: Diagnosis not present

## 2023-01-22 DIAGNOSIS — I2510 Atherosclerotic heart disease of native coronary artery without angina pectoris: Secondary | ICD-10-CM | POA: Diagnosis not present

## 2023-01-22 DIAGNOSIS — M1990 Unspecified osteoarthritis, unspecified site: Secondary | ICD-10-CM | POA: Diagnosis not present

## 2023-04-24 ENCOUNTER — Other Ambulatory Visit: Payer: Self-pay

## 2023-04-24 MED ORDER — SPIRONOLACTONE 25 MG PO TABS: ORAL_TABLET | ORAL | 1 refills | Status: DC

## 2023-08-25 ENCOUNTER — Other Ambulatory Visit: Payer: Self-pay | Admitting: Cardiology

## 2023-11-05 ENCOUNTER — Other Ambulatory Visit: Payer: Self-pay

## 2023-11-05 MED ORDER — SPIRONOLACTONE 25 MG PO TABS: ORAL_TABLET | ORAL | 0 refills | Status: AC

## 2023-11-22 DIAGNOSIS — E039 Hypothyroidism, unspecified: Secondary | ICD-10-CM | POA: Diagnosis not present

## 2023-11-22 DIAGNOSIS — R7301 Impaired fasting glucose: Secondary | ICD-10-CM | POA: Diagnosis not present

## 2023-11-22 DIAGNOSIS — I10 Essential (primary) hypertension: Secondary | ICD-10-CM | POA: Diagnosis not present

## 2023-11-22 DIAGNOSIS — E785 Hyperlipidemia, unspecified: Secondary | ICD-10-CM | POA: Diagnosis not present

## 2023-11-22 DIAGNOSIS — N400 Enlarged prostate without lower urinary tract symptoms: Secondary | ICD-10-CM | POA: Diagnosis not present

## 2023-11-30 DIAGNOSIS — K219 Gastro-esophageal reflux disease without esophagitis: Secondary | ICD-10-CM | POA: Diagnosis not present

## 2023-11-30 DIAGNOSIS — R7301 Impaired fasting glucose: Secondary | ICD-10-CM | POA: Diagnosis not present

## 2023-11-30 DIAGNOSIS — N400 Enlarged prostate without lower urinary tract symptoms: Secondary | ICD-10-CM | POA: Diagnosis not present

## 2023-11-30 DIAGNOSIS — Z1331 Encounter for screening for depression: Secondary | ICD-10-CM | POA: Diagnosis not present

## 2023-11-30 DIAGNOSIS — N529 Male erectile dysfunction, unspecified: Secondary | ICD-10-CM | POA: Diagnosis not present

## 2023-11-30 DIAGNOSIS — E785 Hyperlipidemia, unspecified: Secondary | ICD-10-CM | POA: Diagnosis not present

## 2023-11-30 DIAGNOSIS — I2510 Atherosclerotic heart disease of native coronary artery without angina pectoris: Secondary | ICD-10-CM | POA: Diagnosis not present

## 2023-11-30 DIAGNOSIS — I10 Essential (primary) hypertension: Secondary | ICD-10-CM | POA: Diagnosis not present

## 2023-11-30 DIAGNOSIS — Z0000 Encounter for general adult medical examination without abnormal findings: Secondary | ICD-10-CM | POA: Diagnosis not present

## 2023-11-30 DIAGNOSIS — Z1339 Encounter for screening examination for other mental health and behavioral disorders: Secondary | ICD-10-CM | POA: Diagnosis not present

## 2023-11-30 DIAGNOSIS — Z8249 Family history of ischemic heart disease and other diseases of the circulatory system: Secondary | ICD-10-CM | POA: Diagnosis not present

## 2024-01-16 DIAGNOSIS — Z008 Encounter for other general examination: Secondary | ICD-10-CM | POA: Diagnosis not present
